# Patient Record
Sex: Female | Born: 1955 | Race: White | Hispanic: No | Marital: Married | Smoking: Former smoker
Health system: Southern US, Community
[De-identification: ages and names within clinical notes are randomized; demographics above are authoritative.]

## PROBLEM LIST (undated history)

## (undated) DIAGNOSIS — E119 Type 2 diabetes mellitus without complications: Secondary | ICD-10-CM

## (undated) DIAGNOSIS — I10 Essential (primary) hypertension: Secondary | ICD-10-CM

## (undated) HISTORY — PX: BREAST BIOPSY: SHX20

## (undated) HISTORY — PX: TONSILECTOMY, ADENOIDECTOMY, BILATERAL MYRINGOTOMY AND TUBES: SHX2538

## (undated) HISTORY — PX: BREAST EXCISIONAL BIOPSY: SUR124

## (undated) HISTORY — PX: CHOLECYSTECTOMY: SHX55

---

## 2009-11-09 ENCOUNTER — Inpatient Hospital Stay (HOSPITAL_COMMUNITY): Admission: EM | Admit: 2009-11-09 | Discharge: 2009-11-12 | Payer: Self-pay | Admitting: Emergency Medicine

## 2009-11-09 ENCOUNTER — Emergency Department (HOSPITAL_COMMUNITY): Admission: EM | Admit: 2009-11-09 | Discharge: 2009-11-09 | Payer: Self-pay | Admitting: Family Medicine

## 2011-01-17 LAB — CBC
HCT: 24.8 % — ABNORMAL LOW (ref 36.0–46.0)
HCT: 26.1 % — ABNORMAL LOW (ref 36.0–46.0)
Hemoglobin: 7.8 g/dL — ABNORMAL LOW (ref 12.0–15.0)
Hemoglobin: 8.2 g/dL — ABNORMAL LOW (ref 12.0–15.0)
MCHC: 31.4 g/dL (ref 30.0–36.0)
MCHC: 31.5 g/dL (ref 30.0–36.0)
MCHC: 31.7 g/dL (ref 30.0–36.0)
MCV: 78.2 fL (ref 78.0–100.0)
Platelets: 381 10*3/uL (ref 150–400)
Platelets: 443 10*3/uL — ABNORMAL HIGH (ref 150–400)
RBC: 3.22 MIL/uL — ABNORMAL LOW (ref 3.87–5.11)
RBC: 3.34 MIL/uL — ABNORMAL LOW (ref 3.87–5.11)
RDW: 20.3 % — ABNORMAL HIGH (ref 11.5–15.5)
RDW: 20.3 % — ABNORMAL HIGH (ref 11.5–15.5)
RDW: 20.4 % — ABNORMAL HIGH (ref 11.5–15.5)
WBC: 11.1 10*3/uL — ABNORMAL HIGH (ref 4.0–10.5)

## 2011-01-17 LAB — IRON AND TIBC
Iron: 11 ug/dL — ABNORMAL LOW (ref 42–135)
Saturation Ratios: 3 % — ABNORMAL LOW (ref 20–55)
UIBC: 308 ug/dL

## 2011-01-17 LAB — DIFFERENTIAL
Basophils Absolute: 0.1 10*3/uL (ref 0.0–0.1)
Basophils Absolute: 0.1 10*3/uL (ref 0.0–0.1)
Basophils Relative: 0 % (ref 0–1)
Eosinophils Relative: 1 % (ref 0–5)
Lymphocytes Relative: 10 % — ABNORMAL LOW (ref 12–46)
Lymphocytes Relative: 18 % (ref 12–46)
Lymphs Abs: 2 10*3/uL (ref 0.7–4.0)
Neutro Abs: 13.8 10*3/uL — ABNORMAL HIGH (ref 1.7–7.7)
Neutro Abs: 8.1 10*3/uL — ABNORMAL HIGH (ref 1.7–7.7)

## 2011-01-17 LAB — GLUCOSE, CAPILLARY
Glucose-Capillary: 146 mg/dL — ABNORMAL HIGH (ref 70–99)
Glucose-Capillary: 147 mg/dL — ABNORMAL HIGH (ref 70–99)

## 2011-01-17 LAB — HEMOGLOBIN A1C: Mean Plasma Glucose: 214 mg/dL

## 2011-01-17 LAB — BASIC METABOLIC PANEL
BUN: 10 mg/dL (ref 6–23)
BUN: 11 mg/dL (ref 6–23)
CO2: 26 mEq/L (ref 19–32)
Calcium: 8.5 mg/dL (ref 8.4–10.5)
Calcium: 8.7 mg/dL (ref 8.4–10.5)
Creatinine, Ser: 1.01 mg/dL (ref 0.4–1.2)
GFR calc non Af Amer: 58 mL/min — ABNORMAL LOW (ref >60)
GFR calc non Af Amer: >60 mL/min (ref >60)
Glucose, Bld: 217 mg/dL — ABNORMAL HIGH (ref 70–99)
Glucose, Bld: 279 mg/dL — ABNORMAL HIGH (ref 70–99)
Potassium: 3.8 mEq/L (ref 3.5–5.1)
Sodium: 136 mEq/L (ref 135–145)

## 2011-01-17 LAB — CROSSMATCH: Antibody Screen: NEGATIVE

## 2011-01-17 LAB — FERRITIN: Ferritin: 12 ng/mL (ref 10–291)

## 2011-01-17 LAB — VITAMIN B12: Vitamin B-12: 1145 pg/mL — ABNORMAL HIGH (ref 211–911)

## 2016-01-06 ENCOUNTER — Other Ambulatory Visit: Payer: Self-pay

## 2016-01-06 DIAGNOSIS — Z1231 Encounter for screening mammogram for malignant neoplasm of breast: Secondary | ICD-10-CM

## 2016-01-21 ENCOUNTER — Ambulatory Visit
Admission: RE | Admit: 2016-01-21 | Discharge: 2016-01-21 | Disposition: A | Discharge status: Home / Self Care | Payer: BLUE CROSS/BLUE SHIELD | Source: Ambulatory Visit

## 2016-01-21 DIAGNOSIS — Z1231 Encounter for screening mammogram for malignant neoplasm of breast: Secondary | ICD-10-CM

## 2016-01-22 ENCOUNTER — Other Ambulatory Visit: Payer: Self-pay | Admitting: Internal Medicine

## 2016-01-22 DIAGNOSIS — R928 Other abnormal and inconclusive findings on diagnostic imaging of breast: Secondary | ICD-10-CM

## 2016-01-28 ENCOUNTER — Ambulatory Visit
Admission: RE | Admit: 2016-01-28 | Discharge: 2016-01-28 | Disposition: A | Discharge status: Home / Self Care | Payer: BLUE CROSS/BLUE SHIELD | Source: Ambulatory Visit | Attending: Internal Medicine | Admitting: Internal Medicine

## 2016-01-28 DIAGNOSIS — R928 Other abnormal and inconclusive findings on diagnostic imaging of breast: Secondary | ICD-10-CM

## 2017-02-25 ENCOUNTER — Other Ambulatory Visit: Payer: Self-pay | Admitting: Internal Medicine

## 2017-02-25 DIAGNOSIS — Z1231 Encounter for screening mammogram for malignant neoplasm of breast: Secondary | ICD-10-CM

## 2017-03-24 ENCOUNTER — Ambulatory Visit: Payer: BLUE CROSS/BLUE SHIELD

## 2017-05-05 ENCOUNTER — Ambulatory Visit
Admission: RE | Admit: 2017-05-05 | Discharge: 2017-05-05 | Disposition: A | Discharge status: Home / Self Care | Payer: BLUE CROSS/BLUE SHIELD | Source: Ambulatory Visit | Attending: Internal Medicine | Admitting: Internal Medicine

## 2017-05-05 DIAGNOSIS — Z1231 Encounter for screening mammogram for malignant neoplasm of breast: Secondary | ICD-10-CM

## 2018-09-18 ENCOUNTER — Other Ambulatory Visit: Payer: Self-pay | Admitting: Internal Medicine

## 2018-09-18 DIAGNOSIS — Z1231 Encounter for screening mammogram for malignant neoplasm of breast: Secondary | ICD-10-CM

## 2018-11-02 ENCOUNTER — Encounter: Payer: Self-pay | Admitting: Radiology

## 2018-11-02 ENCOUNTER — Ambulatory Visit
Admission: RE | Admit: 2018-11-02 | Discharge: 2018-11-02 | Disposition: A | Discharge status: Home / Self Care | Payer: BLUE CROSS/BLUE SHIELD | Source: Ambulatory Visit | Attending: Internal Medicine | Admitting: Internal Medicine

## 2018-11-02 DIAGNOSIS — Z1231 Encounter for screening mammogram for malignant neoplasm of breast: Secondary | ICD-10-CM

## 2019-09-10 ENCOUNTER — Encounter (HOSPITAL_COMMUNITY): Payer: Self-pay | Admitting: Emergency Medicine

## 2019-09-10 ENCOUNTER — Emergency Department (HOSPITAL_COMMUNITY): Payer: BC Managed Care – PPO

## 2019-09-10 ENCOUNTER — Emergency Department (HOSPITAL_COMMUNITY)
Admission: EM | Admit: 2019-09-10 | Discharge: 2019-09-11 | Disposition: A | Discharge status: Home / Self Care | Payer: BC Managed Care – PPO | Attending: Emergency Medicine | Admitting: Emergency Medicine

## 2019-09-10 DIAGNOSIS — I10 Essential (primary) hypertension: Secondary | ICD-10-CM | POA: Diagnosis not present

## 2019-09-10 DIAGNOSIS — E1165 Type 2 diabetes mellitus with hyperglycemia: Secondary | ICD-10-CM | POA: Diagnosis not present

## 2019-09-10 DIAGNOSIS — I639 Cerebral infarction, unspecified: Secondary | ICD-10-CM | POA: Diagnosis present

## 2019-09-10 DIAGNOSIS — R739 Hyperglycemia, unspecified: Secondary | ICD-10-CM

## 2019-09-10 HISTORY — DX: Essential (primary) hypertension: I10

## 2019-09-10 HISTORY — DX: Type 2 diabetes mellitus without complications: E11.9

## 2019-09-10 LAB — ETHANOL: Alcohol, Ethyl (B): <10 mg/dL (ref <10)

## 2019-09-10 LAB — COMPREHENSIVE METABOLIC PANEL
ALT: 22 U/L (ref 0–44)
AST: 19 U/L (ref 15–41)
Albumin: 3.9 g/dL (ref 3.5–5.0)
Alkaline Phosphatase: 59 U/L (ref 38–126)
Anion gap: 12 (ref 5–15)
BUN: 22 mg/dL (ref 8–23)
CO2: 23 mmol/L (ref 22–32)
Calcium: 9.7 mg/dL (ref 8.9–10.3)
Chloride: 96 mmol/L — ABNORMAL LOW (ref 98–111)
Creatinine, Ser: 0.99 mg/dL (ref 0.44–1.00)
GFR calc Af Amer: >60 mL/min (ref >60)
GFR calc non Af Amer: >60 mL/min (ref >60)
Glucose, Bld: 400 mg/dL — ABNORMAL HIGH (ref 70–99)
Potassium: 3.7 mmol/L (ref 3.5–5.1)
Sodium: 131 mmol/L — ABNORMAL LOW (ref 135–145)
Total Bilirubin: 0.8 mg/dL (ref 0.3–1.2)
Total Protein: 7.1 g/dL (ref 6.5–8.1)

## 2019-09-10 LAB — I-STAT CHEM 8, ED
BUN: 24 mg/dL — ABNORMAL HIGH (ref 8–23)
Calcium, Ion: 1.26 mmol/L (ref 1.15–1.40)
Chloride: 97 mmol/L — ABNORMAL LOW (ref 98–111)
Creatinine, Ser: 0.8 mg/dL (ref 0.44–1.00)
Glucose, Bld: 418 mg/dL — ABNORMAL HIGH (ref 70–99)
HCT: 44 % (ref 36.0–46.0)
Hemoglobin: 15 g/dL (ref 12.0–15.0)
Potassium: 3.7 mmol/L (ref 3.5–5.1)
Sodium: 134 mmol/L — ABNORMAL LOW (ref 135–145)
TCO2: 26 mmol/L (ref 22–32)

## 2019-09-10 LAB — CBC
HCT: 42.3 % (ref 36.0–46.0)
Hemoglobin: 14.2 g/dL (ref 12.0–15.0)
MCH: 29.5 pg (ref 26.0–34.0)
MCHC: 33.6 g/dL (ref 30.0–36.0)
MCV: 87.9 fL (ref 80.0–100.0)
Platelets: 261 10*3/uL (ref 150–400)
RBC: 4.81 MIL/uL (ref 3.87–5.11)
RDW: 12 % (ref 11.5–15.5)
WBC: 9.3 10*3/uL (ref 4.0–10.5)
nRBC: 0 % (ref 0.0–0.2)

## 2019-09-10 LAB — CBG MONITORING, ED: Glucose-Capillary: 413 mg/dL — ABNORMAL HIGH (ref 70–99)

## 2019-09-10 LAB — PROTIME-INR
INR: 1 (ref 0.8–1.2)
Prothrombin Time: 13.2 seconds (ref 11.4–15.2)

## 2019-09-10 LAB — DIFFERENTIAL
Abs Immature Granulocytes: 0.07 10*3/uL (ref 0.00–0.07)
Basophils Absolute: 0 10*3/uL (ref 0.0–0.1)
Basophils Relative: 0 %
Eosinophils Absolute: 0.1 10*3/uL (ref 0.0–0.5)
Eosinophils Relative: 1 %
Immature Granulocytes: 1 %
Lymphocytes Relative: 26 %
Lymphs Abs: 2.4 10*3/uL (ref 0.7–4.0)
Monocytes Absolute: 0.7 10*3/uL (ref 0.1–1.0)
Monocytes Relative: 8 %
Neutro Abs: 6 10*3/uL (ref 1.7–7.7)
Neutrophils Relative %: 64 %

## 2019-09-10 LAB — APTT: aPTT: 26 seconds (ref 24–36)

## 2019-09-10 NOTE — ED Triage Notes (Signed)
Pt here with c/o right side weakness that started Friday along with some memory loss , pt cbg 413 , pt does have a history of htn

## 2019-09-11 ENCOUNTER — Emergency Department (HOSPITAL_COMMUNITY): Payer: BC Managed Care – PPO

## 2019-09-11 LAB — URINALYSIS, ROUTINE W REFLEX MICROSCOPIC
Bilirubin Urine: NEGATIVE
Glucose, UA: >=500 mg/dL — AB
Hgb urine dipstick: NEGATIVE
Ketones, ur: NEGATIVE mg/dL
Nitrite: NEGATIVE
Protein, ur: NEGATIVE mg/dL
Specific Gravity, Urine: 1.003 — ABNORMAL LOW (ref 1.005–1.030)
pH: 6 (ref 5.0–8.0)

## 2019-09-11 LAB — RAPID URINE DRUG SCREEN, HOSP PERFORMED
Amphetamines: NOT DETECTED
Barbiturates: NOT DETECTED
Benzodiazepines: NOT DETECTED
Cocaine: NOT DETECTED
Opiates: NOT DETECTED
Tetrahydrocannabinol: NOT DETECTED

## 2019-09-11 LAB — CBG MONITORING, ED
Glucose-Capillary: 324 mg/dL — ABNORMAL HIGH (ref 70–99)
Glucose-Capillary: 325 mg/dL — ABNORMAL HIGH (ref 70–99)

## 2019-09-11 MED ORDER — METFORMIN HCL 500 MG PO TABS: 750.0000 mg | ORAL_TABLET | Freq: Three times a day (TID) | ORAL | 1 refills | Status: DC

## 2019-09-11 MED ORDER — INSULIN ASPART 100 UNIT/ML ~~LOC~~ SOLN
5.0000 [IU] | Freq: Once | SUBCUTANEOUS | Status: AC
  Administered 2019-09-11: 5 [IU] via SUBCUTANEOUS

## 2019-09-11 MED ORDER — METFORMIN HCL ER 750 MG PO TB24: 750.0000 mg | ORAL_TABLET | Freq: Three times a day (TID) | ORAL | 1 refills | Status: DC

## 2019-09-11 MED ORDER — INSULIN REGULAR(HUMAN) IN NACL 100-0.9 UT/100ML-% IV SOLN: INTRAVENOUS | Status: DC

## 2019-09-11 MED ORDER — DEXTROSE-NACL 5-0.45 % IV SOLN: INTRAVENOUS | Status: DC

## 2019-09-11 MED ORDER — SODIUM CHLORIDE 0.9 % IV BOLUS
1000.0000 mL | Freq: Once | INTRAVENOUS | Status: AC
  Administered 2019-09-11: 07:00:00 1000 mL via INTRAVENOUS

## 2019-09-11 NOTE — ED Notes (Signed)
Pt is NSR on monitor 

## 2019-09-11 NOTE — Discharge Instructions (Signed)
The lab work-up and MRI of your brain did not reveal any concerning abnormalities. We recommend that you follow-up with neurologist in 2 weeks. Return to the ER if you have severe symptoms again.  Additionally, we have prescribed you Metformin 750 mg 3 times daily.  Please get a primary care doctor to help you with diabetes.  The medicine is available at [facility] at the moment.

## 2019-09-11 NOTE — ED Notes (Signed)
Patient transported to MRI 

## 2019-09-11 NOTE — ED Provider Notes (Addendum)
  Physical Exam  BP 119/86   Pulse 64   Temp 97.6 F (36.4 C) (Oral)   Resp 10   Ht 5\' 10"  (1.778 m)   Wt 99.8 kg   SpO2 95%   BMI 31.57 kg/m   Physical Exam  ED Course/Procedures   Clinical Course as of Sep 10 1042  Tue Sep 11, 2019  0621 Spoke with Dr. Leonel Ramsay.  He recommends MRI.     [CH]    Clinical Course User Index [CH] Horton, Barbette Hair, MD    Procedures  MDM   Assuming care of patient from Dr. Dina Rich.   Patient in the ED for right-sided weakness along with some aphasia and memory loss type symptoms. Workup thus far shows slightly elevated blood sugar without CT abnormalities./  Concerning findings are as following : None Important pending results are : MRI of the brain/  According to Dr. Dina Rich, plan is to discharge the patient if the MRI is negative with neuro follow-up.  Patient had no complains, no concerns from the nursing side. Will continue to monitor.  10:48 AM Results of the MRI discussed with the patient.  MRI is negative for any acute findings.  She will follow-up with neurology as an outpatient. Patient request that we refill her diabetes medication.  She was on Metformin but it was removed off of the shelf therefore she was switched to Jardiance or Trulicity and she could not afford those medications.  She has no longer been seeing the same doctor and wants an alternative.  I called [facility] and they have Metformin 750 available now.  She will be prescribed her 750 3 times daily by [place] with 1 refill as she will not be able to get a new doctor until January.  She reports that Metformin 750 3 times daily was managing her blood sugar appropriately and her A1c was under control.  She understands that the ED doctor is not appropriate for chronic meds and condition like diabetes.       Varney Biles, MD 09/11/19 1058

## 2019-09-11 NOTE — ED Provider Notes (Signed)
[facility] EMERGENCY DEPARTMENT Provider Note   CSN: [id number] Arrival date & time: 09/10/19  1817     History   Chief Complaint Chief Complaint  Patient presents with  . Cerebrovascular Accident    HPI Elizabeth Wood is a 63 y.o. female.     HPI  This is a 63 year old female with a history of diabetes and hypertension who presents with strokelike symptoms.  Patient reports she had onset of "feeling weird" on Friday.  She describes an episode of knowing what I wanted to do but not being able to do it.  She states that she was looking at the phone but could not come up with the word or reach out to grab it.  Symptoms improved however she continued to feel foggy throughout the weekend.  She states that at 5 PM yesterday she had another significant episode where she looked at the phone, knew what she wanted to say but could not say it.  She describes reaching out for things but them not being in the right direction.  She denies any recent illnesses or fevers.  She has not noted any weakness or gait disturbance.  She is a diabetic.  Past Medical History:  Diagnosis Date  . Diabetes mellitus without complication ([place])   . Hypertension     There are no active problems to display for this patient.   Past Surgical History:  Procedure Laterality Date  . BREAST BIOPSY Left    benign  . BREAST BIOPSY Right    benign  . BREAST EXCISIONAL BIOPSY Left   . BREAST EXCISIONAL BIOPSY Right      OB History   No obstetric history on file.      Home Medications    Prior to Admission medications   Not on File    Family History History reviewed. No pertinent family history.  Social History Social History   Tobacco Use  . Smoking status: Never Smoker  . Smokeless tobacco: Never Used  Substance Use Topics  . Alcohol use: Not on file  . Drug use: Not on file     Allergies   Patient has no allergy information on record.   Review of Systems Review  of Systems  Constitutional: Negative for fever.  Respiratory: Negative for shortness of breath.   Cardiovascular: Negative for chest pain.  Gastrointestinal: Negative for abdominal pain, nausea and vomiting.  Genitourinary: Negative for dysuria.  Neurological: Positive for speech difficulty. Negative for dizziness, weakness and headaches.  All other systems reviewed and are negative.    Physical Exam Updated Vital Signs BP 123/83   Pulse 68   Temp 97.6 F (36.4 C) (Oral)   Resp 14   Ht 1.778 m (5\' 10" )   Wt 99.8 kg   SpO2 95%   BMI 31.57 kg/m   Physical Exam Vitals signs and nursing note reviewed.  Constitutional:      Appearance: She is well-developed. She is obese. She is not ill-appearing.  HENT:     Head: Normocephalic and atraumatic.     Mouth/Throat:     Mouth: Mucous membranes are moist.  Eyes:     Pupils: Pupils are equal, round, and reactive to light.  Neck:     Musculoskeletal: Neck supple.  Cardiovascular:     Rate and Rhythm: Normal rate and regular rhythm.     Heart sounds: Normal heart sounds.  Pulmonary:     Effort: Pulmonary effort is normal. No respiratory distress.  Breath sounds: No wheezing.  Abdominal:     Palpations: Abdomen is soft.     Tenderness: There is no abdominal tenderness.  Musculoskeletal:     Right lower leg: No edema.     Left lower leg: No edema.  Skin:    General: Skin is warm and dry.  Neurological:     Mental Status: She is alert and oriented to person, place, and time.     Comments: Fluent speech, oriented x3, no drift, patient seems to not be able to have distinction between the fingers specifically on her right hand worse than left hand, 5 of 5 strength in all 4 extremities  Psychiatric:     Comments: Anxious      ED Treatments / Results  Labs (all labs ordered are listed, but only abnormal results are displayed) Labs Reviewed  COMPREHENSIVE METABOLIC PANEL - Abnormal; Notable for the following components:       Result Value   Sodium 131 (*)    Chloride 96 (*)    Glucose, Bld 400 (*)    All other components within normal limits  CBG MONITORING, ED - Abnormal; Notable for the following components:   Glucose-Capillary 413 (*)    All other components within normal limits  I-STAT CHEM 8, ED - Abnormal; Notable for the following components:   Sodium 134 (*)    Chloride 97 (*)    BUN 24 (*)    Glucose, Bld 418 (*)    All other components within normal limits  CBG MONITORING, ED - Abnormal; Notable for the following components:   Glucose-Capillary 324 (*)    All other components within normal limits  ETHANOL  PROTIME-INR  APTT  CBC  DIFFERENTIAL  RAPID URINE DRUG SCREEN, HOSP PERFORMED  URINALYSIS, ROUTINE W REFLEX MICROSCOPIC    EKG None  Radiology Ct Head Wo Contrast  Result Date: 09/10/2019 CLINICAL DATA:  Right-sided weakness with memory loss EXAM: CT HEAD WITHOUT CONTRAST TECHNIQUE: Contiguous axial images were obtained from the base of the skull through the vertex without intravenous contrast. COMPARISON:  None. FINDINGS: Brain: No acute territorial infarction, hemorrhage, or intracranial mass. Small focal hypodensity within the left centrum semiovale which may reflect age indeterminate lacunar infarct. The ventricles are of normal size. Vascular: No hyperdense vessels. Vertebral and carotid vascular calcification Skull: Normal. Negative for fracture or focal lesion. Sinuses/Orbits: No acute finding. Other: None IMPRESSION: 1. Negative for hemorrhage or intracranial mass. 2. 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 Electronically Signed   By: Jasmine Pang M.D.   On: 09/10/2019 19:35    Procedures Procedures (including critical care time)  Medications Ordered in ED Medications  sodium chloride 0.9 % bolus 1,000 mL (1,000 mLs Intravenous New Bag/Given 09/11/19 3818)     Initial Impression / Assessment and Plan / ED Course  I have reviewed the  triage vital signs and the nursing notes.  Pertinent labs & imaging results that were available during my care of the patient were reviewed by me and considered in my medical decision making (see chart for details).  Clinical Course as of Sep 10 701  Tue Sep 11, 2019  2993 Spoke with Dr. Amada Jupiter.  He recommends MRI.     [CH]    Clinical Course User Index [CH] Horton, Mayer Masker, MD       Patient presents with some feelings of word finding difficulty and proprioceptive difficulties.  She is nontoxic-appearing but anxious and vital signs are  reassuring.  Only neurologic deficit that I can pick up on exam is difficulty with distinction between her fingers on the right hand greater than the left hand.  I discussed the patient with Dr. Amada JupiterKirkpatrick.  Given ongoing symptoms, he recommends MRI to determine disposition.  CT scan did show an age-indeterminate likely lacunar infarct.  Final Clinical Impressions(s) / ED Diagnoses   Final diagnoses:  None    ED Discharge Orders    None       Shon BatonHorton, Courtney F, MD 09/11/19 [phone]

## 2020-04-07 ENCOUNTER — Other Ambulatory Visit: Payer: Self-pay | Admitting: Internal Medicine

## 2020-04-07 ENCOUNTER — Other Ambulatory Visit: Payer: Self-pay | Admitting: Family Medicine

## 2020-04-07 DIAGNOSIS — Z1231 Encounter for screening mammogram for malignant neoplasm of breast: Secondary | ICD-10-CM

## 2020-04-14 ENCOUNTER — Ambulatory Visit: Payer: BC Managed Care – PPO | Attending: Internal Medicine

## 2020-04-14 DIAGNOSIS — Z23 Encounter for immunization: Secondary | ICD-10-CM

## 2020-04-14 NOTE — Progress Notes (Signed)
   Covid-19 Vaccination Clinic  Name:  Elizabeth Wood    MRN: [id number] DOB: 1956/09/09  04/14/2020  [name] was observed post Covid-19 immunization for 30 minutes based on pre-vaccination screening without incident. She was provided with Vaccine Information Sheet and instruction to access the V-Safe system.   [name] was instructed to call 911 with any severe reactions post vaccine: Marland Kitchen Difficulty breathing  . Swelling of face and throat  . A fast heartbeat  . A bad rash all over body  . Dizziness and weakness   Immunizations Administered    Name Date Dose VIS Date Route   JANSSEN COVID-19 VACCINE 04/14/2020  9:29 AM 0.5 mL 12/29/2019 Intramuscular   Manufacturer: [employer]   Lot: [id number]   NDC: 16073-710-62

## 2020-04-18 ENCOUNTER — Ambulatory Visit: Payer: BC Managed Care – PPO

## 2020-06-10 ENCOUNTER — Other Ambulatory Visit: Payer: Self-pay

## 2020-06-10 ENCOUNTER — Other Ambulatory Visit: Payer: Self-pay | Admitting: Family Medicine

## 2020-06-10 ENCOUNTER — Ambulatory Visit
Admission: RE | Admit: 2020-06-10 | Discharge: 2020-06-10 | Disposition: A | Discharge status: Home / Self Care | Payer: BC Managed Care – PPO | Source: Ambulatory Visit | Attending: Family Medicine | Admitting: Family Medicine

## 2020-06-10 DIAGNOSIS — N63 Unspecified lump in unspecified breast: Secondary | ICD-10-CM

## 2020-06-10 DIAGNOSIS — Z1231 Encounter for screening mammogram for malignant neoplasm of breast: Secondary | ICD-10-CM

## 2020-07-01 ENCOUNTER — Other Ambulatory Visit: Payer: Self-pay

## 2020-07-01 ENCOUNTER — Ambulatory Visit
Admission: RE | Admit: 2020-07-01 | Discharge: 2020-07-01 | Disposition: A | Discharge status: Home / Self Care | Payer: BC Managed Care – PPO | Source: Ambulatory Visit | Attending: Family Medicine | Admitting: Family Medicine

## 2020-07-01 ENCOUNTER — Ambulatory Visit: Payer: BC Managed Care – PPO

## 2020-07-01 DIAGNOSIS — N63 Unspecified lump in unspecified breast: Secondary | ICD-10-CM

## 2022-04-08 ENCOUNTER — Other Ambulatory Visit: Payer: Self-pay | Admitting: Family Medicine

## 2022-04-08 DIAGNOSIS — Z1231 Encounter for screening mammogram for malignant neoplasm of breast: Secondary | ICD-10-CM

## 2022-04-13 ENCOUNTER — Ambulatory Visit
Admission: RE | Admit: 2022-04-13 | Discharge: 2022-04-13 | Disposition: A | Discharge status: Home / Self Care | Payer: BC Managed Care – PPO | Source: Ambulatory Visit | Attending: Family Medicine | Admitting: Family Medicine

## 2022-04-13 DIAGNOSIS — Z1231 Encounter for screening mammogram for malignant neoplasm of breast: Secondary | ICD-10-CM

## 2023-08-11 ENCOUNTER — Other Ambulatory Visit: Payer: Self-pay | Admitting: Family Medicine

## 2023-08-11 DIAGNOSIS — E2839 Other primary ovarian failure: Secondary | ICD-10-CM

## 2023-10-10 ENCOUNTER — Other Ambulatory Visit: Payer: Self-pay | Admitting: Family Medicine

## 2023-10-10 DIAGNOSIS — Z0000 Encounter for general adult medical examination without abnormal findings: Secondary | ICD-10-CM

## 2023-10-11 ENCOUNTER — Ambulatory Visit: Payer: BC Managed Care – PPO

## 2023-10-13 ENCOUNTER — Ambulatory Visit
Admission: RE | Admit: 2023-10-13 | Discharge: 2023-10-13 | Disposition: A | Discharge status: Home / Self Care | Payer: BC Managed Care – PPO | Source: Ambulatory Visit | Attending: Family Medicine | Admitting: Family Medicine

## 2023-10-13 DIAGNOSIS — Z0000 Encounter for general adult medical examination without abnormal findings: Secondary | ICD-10-CM

## 2023-10-17 ENCOUNTER — Other Ambulatory Visit: Payer: Self-pay | Admitting: Family Medicine

## 2023-10-17 DIAGNOSIS — R928 Other abnormal and inconclusive findings on diagnostic imaging of breast: Secondary | ICD-10-CM

## 2023-10-31 ENCOUNTER — Other Ambulatory Visit: Payer: Self-pay | Admitting: Family Medicine

## 2023-10-31 ENCOUNTER — Ambulatory Visit
Admission: RE | Admit: 2023-10-31 | Discharge: 2023-10-31 | Disposition: A | Discharge status: Home / Self Care | Payer: BC Managed Care – PPO | Source: Ambulatory Visit | Attending: Family Medicine | Admitting: Family Medicine

## 2023-10-31 DIAGNOSIS — R928 Other abnormal and inconclusive findings on diagnostic imaging of breast: Secondary | ICD-10-CM

## 2023-10-31 DIAGNOSIS — N6320 Unspecified lump in the left breast, unspecified quadrant: Secondary | ICD-10-CM

## 2023-10-31 HISTORY — PX: BREAST BIOPSY: SHX20

## 2023-11-01 LAB — SURGICAL PATHOLOGY

## 2023-11-02 DIAGNOSIS — C801 Malignant (primary) neoplasm, unspecified: Secondary | ICD-10-CM

## 2023-11-02 HISTORY — DX: Malignant (primary) neoplasm, unspecified: C80.1

## 2023-11-02 HISTORY — PX: BREAST LUMPECTOMY: SHX2

## 2023-11-03 ENCOUNTER — Telehealth: Payer: Self-pay | Admitting: *Deleted

## 2023-11-03 NOTE — Telephone Encounter (Signed)
 Spoke to patient to confirm upcoming morning [facility] clinic appointment on 1/8, paperwork will be sent via mail.  Gave location and time, also informed patient that the surgeon's office would be calling as well to get information from them similar to the packet that they will be receiving so make sure to do both.  Reminded patient that all providers will be coming to the clinic to see them HERE and if they had any questions to not hesitate to reach back out to myself or their navigators.

## 2023-11-07 ENCOUNTER — Encounter: Payer: Self-pay | Admitting: *Deleted

## 2023-11-07 DIAGNOSIS — C50412 Malignant neoplasm of upper-outer quadrant of left female breast: Secondary | ICD-10-CM | POA: Insufficient documentation

## 2023-11-08 NOTE — Progress Notes (Signed)
 Radiation Oncology         [phone] ________________________________  Initial Outpatient Consultation  Name: Elizabeth Wood MRN: [id number]  Date: 11/09/2023  DOB: July 24, 1956  RR:[id number], Lamarr RAMAN, MD  Aron Shoulders, MD   REFERRING PHYSICIAN: Aron Shoulders, MD  DIAGNOSIS:    ICD-10-CM   1. Malignant neoplasm of upper-outer quadrant of left breast in female, estrogen receptor positive (HCC)  C50.412    Z17.0        Cancer Staging  Malignant neoplasm of upper-outer quadrant of left breast in female, estrogen receptor positive (HCC) Staging form: Breast, AJCC 8th Edition - Clinical stage from 11/09/2023: Stage IA (cT1, cN0, cM0, G3, ER+, PR+, HER2-) - Unsigned Stage prefix: Initial diagnosis Histologic grading system: 3 grade system Laterality: Left Staged by: Pathologist and managing physician Stage used in treatment planning: Yes National guidelines used in treatment planning: Yes Type of national guideline used in treatment planning: NCCN    CHIEF COMPLAINT: Here to discuss management of left breast cancer  HISTORY OF PRESENT ILLNESS::Elizabeth Wood is a 69 y.o. female who presented with bilateral breast abnormalities on the following imaging: bilateral screening mammogram on the date of 10/13/23. No symptoms, if any, were reported at that time.  Bilateral diagnostic mammogram and bilateral breast ultrasound on 10/31/23 further revealed a suspicious mass with calcifications in the 2 o'clock left breast measuring 1.7 cm, located 8 cmfn, and a benign sebaceous cyst in the 3 o'clock right breast measuring 1.3 cm. No evidence of lymphadenopathy was demonstrated in either axilla.   Biopsy of the upper outer (2 o'clock) left breast on date of 10/31/23 showed grade 3 invasive ductal carcinoma measuring 1.0 cm in the greatest linear extent of the sample .  ER status: 95% positive and PR status 95% positive, both with strong staining intensity; Proliferation marker Ki67 at 40%;  Her2 status negative; Grade 3. No lymph nodes were examined.   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  She works over 50 hours a week.  She previously worked as a radiation protection practitioner in [place] .  PREVIOUS RADIATION THERAPY: No  PAST MEDICAL HISTORY:  has a past medical history of Diabetes mellitus without complication (HCC) and Hypertension.    PAST SURGICAL HISTORY: Past Surgical History:  Procedure Laterality Date   BREAST BIOPSY Left    benign   BREAST BIOPSY Right    benign   BREAST BIOPSY Left 10/31/2023   US  LT BREAST BX W LOC DEV 1ST LESION IMG BX SPEC US  GUIDE 10/31/2023 [facility] MAMMOGRAPHY   BREAST EXCISIONAL BIOPSY Left    BREAST EXCISIONAL BIOPSY Right    CHOLECYSTECTOMY     TONSILECTOMY, ADENOIDECTOMY, BILATERAL MYRINGOTOMY AND TUBES      FAMILY HISTORY: family history is not on file.  SOCIAL HISTORY:  reports that she has quit smoking. Her smoking use included cigarettes. She has never used smokeless tobacco. She reports that she does not drink alcohol and does not use drugs.  ALLERGIES: Lidocaine and Novocain [procaine]  MEDICATIONS:  Current Outpatient Medications  Medication Sig Dispense Refill   empagliflozin (JARDIANCE) 25 MG TABS tablet 25 mg daily.     gabapentin (NEURONTIN) 300 MG capsule Take 300 mg by mouth at bedtime.     glipiZIDE (GLUCOTROL) 5 MG tablet Take 5 mg by mouth daily before breakfast.     lisinopril (ZESTRIL) 20 MG tablet Take 20 mg by mouth daily.     metFORMIN  (GLUCOPHAGE ) 500 MG tablet Take 1.5 tablets (750  mg total) by mouth 3 (three) times daily after meals. 150 tablet 1   metFORMIN  (GLUCOPHAGE ) 500 MG tablet Take 1,000 mg by mouth 2 (two) times daily with a meal.     OZEMPIC, 1 MG/DOSE, 4 MG/3ML SOPN Inject 1 mg into the skin once a week.     No current facility-administered medications for this encounter.    REVIEW OF SYSTEMS: As above in HPI.   PHYSICAL EXAM:  vitals were not taken for this visit.    General: Alert and oriented, in no acute distress HEENT: Head is normocephalic. Extraocular movements are intact.   Neck: Neck is supple, no palpable cervical or supraclavicular lymphadenopathy. Heart: Regular in rate and rhythm with no murmurs, rubs, or gallops. Chest: Clear to auscultation bilaterally, with no rhonchi, wheezes, or rales. Abdomen: Soft, nontender, nondistended, with no rigidity or guarding. Extremities: No cyanosis or edema. Lymphatics: see Neck Exam Skin: No concerning lesions. Musculoskeletal: symmetric strength and muscle tone throughout. Neurologic: Cranial nerves II through XII are grossly intact. No obvious focalities. Speech is fluent. Coordination is intact. Psychiatric: Judgment and insight are intact. Affect is appropriate. Breasts: At the 12 o'clock position of the left breast there is an area of thickening that extends about 3 cm.  There is a cystic superficial mass that is bluish in color at the 3 o'clock position of the inner right breast.   No other palpable masses appreciated in the breasts or axillae bilaterally.    ECOG = 0  0 - Asymptomatic (Fully active, able to carry on all predisease activities without restriction)  1 - Symptomatic but completely ambulatory (Restricted in physically strenuous activity but ambulatory and able to carry out work of a light or sedentary nature. For example, light housework, office work)  2 - Symptomatic, <50% in bed during the day (Ambulatory and capable of all self care but unable to carry out any work activities. Up and about more than 50% of waking hours)  3 - Symptomatic, >50% in bed, but not bedbound (Capable of only limited self-care, confined to bed or chair 50% or more of waking hours)  4 - Bedbound (Completely disabled. Cannot carry on any self-care. Totally confined to bed or chair)  5 - Death   Raylene MM, Creech RH, Tormey DC, et al. [phone]). Toxicity and response criteria of the [facility]. Am. DOROTHA Bridges. Oncol. 5 (6): 649-55   LABORATORY DATA:  Lab Results  Component Value Date   WBC 9.3 09/10/2019   HGB 15.0 09/10/2019   HCT 44.0 09/10/2019   MCV 87.9 09/10/2019   PLT 261 09/10/2019   CMP     Component Value Date/Time   NA 134 (L) 09/10/2019 1927   K 3.7 09/10/2019 1927   CL 97 (L) 09/10/2019 1927   CO2 23 09/10/2019 1904   GLUCOSE 418 (H) 09/10/2019 1927   BUN 24 (H) 09/10/2019 1927   CREATININE 0.80 09/10/2019 1927   CALCIUM 9.7 09/10/2019 1904   PROT 7.1 09/10/2019 1904   ALBUMIN 3.9 09/10/2019 1904   AST 19 09/10/2019 1904   ALT 22 09/10/2019 1904   ALKPHOS 59 09/10/2019 1904   BILITOT 0.8 09/10/2019 1904   GFRNONAA >60 09/10/2019 1904   GFRAA >60 09/10/2019 1904         RADIOGRAPHY: US  LT BREAST BX W LOC DEV 1ST LESION IMG BX SPEC US  GUIDE Addendum Date: 11/03/2023 ADDENDUM REPORT: 11/03/2023 12:20 ADDENDUM: PATHOLOGY revealed: 1. Breast, left, needle core biopsy, upper outer,  2 o'clock, 8 cmfn :- INVASIVE DUCTAL CARCINOMA. Pathology results are CONCORDANT with imaging findings, per Dr. Chyrl Phi. Pathology results and recommendations were discussed with patient via telephone on 11/01/2023 by Rock Hover RN. Patient reported biopsy site doing well with no adverse symptoms, and only slight tenderness at the site. Post biopsy care instructions were reviewed, questions were answered and my direct phone number was provided. Patient was instructed to call [facility] for any additional questions or concerns related to biopsy site. RECOMMENDATIONS: 1. Surgical and oncological consultation. Request for surgical and oncological consultation relayed to Nurse Oncology Navigators at [facility] on 11/01/2023. Patient is scheduled for MDC (Multidisciplinary Clinic) appointment on 11/09/2023 at 8:00 o'clock. Patient is aware of appointment details. Pathology results reported by Rock Hover RN on 11/03/2023. Electronically Signed   By:  Reyes Phi M.D.   On: 11/03/2023 12:20   Result Date: 11/03/2023 CLINICAL DATA:  68 year old female presents for tissue sampling of 1.7 cm UPPER-OUTER LEFT breast mass. EXAM: ULTRASOUND GUIDED LEFT BREAST CORE NEEDLE BIOPSY COMPARISON:  Previous exam(s). PROCEDURE: I met with the patient and we discussed the procedure of ultrasound-guided biopsy, including benefits and alternatives. We discussed the high likelihood of a successful procedure. We discussed the risks of the procedure, including infection, bleeding, tissue injury, clip migration, and inadequate sampling. Informed written consent was given. The usual time-out protocol was performed immediately prior to the procedure. Lesion quadrant: UPPER-OUTER LEFT breast Using sterile technique and 1% chloroprocaine as local anesthetic, under direct ultrasound visualization, a 12 gauge spring-loaded device was used to perform biopsy of the 1.7 cm mass at the 2 o'clock position of the LEFT breast 8 cm from the nipple using a MEDIAL approach. At the conclusion of the procedure a RIBBON shaped tissue marker clip was deployed into the biopsy cavity. Follow up 2 view mammogram was performed and dictated separately. IMPRESSION: Ultrasound guided biopsy of 1.7 cm UPPER OUTER LEFT breast mass. No apparent complications. Electronically Signed: By: Reyes Phi M.D. On: 10/31/2023 11:50   MM CLIP PLACEMENT LEFT Result Date: 10/31/2023 CLINICAL DATA:  Evaluate RIBBON biopsy clip placement following ultrasound-guided LEFT breast biopsy. EXAM: 3D DIAGNOSTIC LEFT MAMMOGRAM POST ULTRASOUND BIOPSY COMPARISON:  Previous exam(s). FINDINGS: 3D Mammographic images were obtained following ultrasound guided biopsy of the 1.7 cm mass at the 2 o'clock position of the LEFT breast. The biopsy marking clip is in expected position at the site of biopsy. IMPRESSION: Appropriate positioning of the RIBBON shaped biopsy marking clip at the site of biopsy in the UPPER OUTER LEFT breast. Final  Assessment: Post Procedure Mammograms for Marker Placement Electronically Signed   By: Reyes Phi M.D.   On: 10/31/2023 12:00   MM 3D DIAGNOSTIC MAMMOGRAM BILATERAL BREAST Result Date: 10/31/2023 CLINICAL DATA:  68 year old female presenting as a recall from screening for possible right breast mass and possible left breast asymmetry with calcifications. EXAM: DIGITAL DIAGNOSTIC BILATERAL MAMMOGRAM WITH TOMOSYNTHESIS AND CAD; ULTRASOUND RIGHT BREAST LIMITED; ULTRASOUND LEFT BREAST LIMITED TECHNIQUE: Bilateral digital diagnostic mammography and breast tomosynthesis was performed. The images were evaluated with computer-aided detection. ; Targeted ultrasound examination of the right breast was performed; Targeted ultrasound examination of the left breast was performed. COMPARISON:  Previous exam(s). ACR Breast Density Category b: There are scattered areas of fibroglandular density. FINDINGS: Mammogram: Right breast: A skin BB marks the site of a fluctuating sebaceous cyst reported by the patient. This corresponds to the mass identified mammographically. Left breast: Spot compression tomosynthesis  views and spot 2D magnification views were performed demonstrating persistence of an irregular mass in the upper outer left breast with associated small group of calcifications. Overall this area spans 1.8 cm. On physical exam of the upper-outer left breast I feel a fixed discrete mass. Ultrasound: Targeted ultrasound is performed in the right breast at 3 o'clock 14 cm from the nipple demonstrating an oval circumscribed cystic mass within the skin measuring 1.3 x 0.7 x 1.2 cm, most consistent with a benign sebaceous cyst. Targeted ultrasound performed in the left breast at 2 o'clock 8 cm from the nipple demonstrates an irregular hypoechoic mass measuring 1.7 x 0.9 x 1.7 cm. This corresponds to the mammographic finding. Targeted ultrasound of the left axilla demonstrates normal lymph nodes. IMPRESSION: 1. Suspicious mass  with calcifications in the left breast at 2 o'clock measuring 1.7 cm. 2. Benign sebaceous cyst in the right breast at 3 o'clock measuring 1.3 cm. RECOMMENDATION: Ultrasound-guided core needle biopsy x1 of the left breast. I have discussed the findings and recommendations with the patient. If applicable, a reminder letter will be sent to the patient regarding the next appointment. BI-RADS CATEGORY  4: Suspicious. Electronically Signed   By: Inocente Ast M.D.   On: 10/31/2023 11:03   US  LIMITED ULTRASOUND INCLUDING AXILLA LEFT BREAST  Result Date: 10/31/2023 CLINICAL DATA:  68 year old female presenting as a recall from screening for possible right breast mass and possible left breast asymmetry with calcifications. EXAM: DIGITAL DIAGNOSTIC BILATERAL MAMMOGRAM WITH TOMOSYNTHESIS AND CAD; ULTRASOUND RIGHT BREAST LIMITED; ULTRASOUND LEFT BREAST LIMITED TECHNIQUE: Bilateral digital diagnostic mammography and breast tomosynthesis was performed. The images were evaluated with computer-aided detection. ; Targeted ultrasound examination of the right breast was performed; Targeted ultrasound examination of the left breast was performed. COMPARISON:  Previous exam(s). ACR Breast Density Category b: There are scattered areas of fibroglandular density. FINDINGS: Mammogram: Right breast: A skin BB marks the site of a fluctuating sebaceous cyst reported by the patient. This corresponds to the mass identified mammographically. Left breast: Spot compression tomosynthesis views and spot 2D magnification views were performed demonstrating persistence of an irregular mass in the upper outer left breast with associated small group of calcifications. Overall this area spans 1.8 cm. On physical exam of the upper-outer left breast I feel a fixed discrete mass. Ultrasound: Targeted ultrasound is performed in the right breast at 3 o'clock 14 cm from the nipple demonstrating an oval circumscribed cystic mass within the skin measuring  1.3 x 0.7 x 1.2 cm, most consistent with a benign sebaceous cyst. Targeted ultrasound performed in the left breast at 2 o'clock 8 cm from the nipple demonstrates an irregular hypoechoic mass measuring 1.7 x 0.9 x 1.7 cm. This corresponds to the mammographic finding. Targeted ultrasound of the left axilla demonstrates normal lymph nodes. IMPRESSION: 1. Suspicious mass with calcifications in the left breast at 2 o'clock measuring 1.7 cm. 2. Benign sebaceous cyst in the right breast at 3 o'clock measuring 1.3 cm. RECOMMENDATION: Ultrasound-guided core needle biopsy x1 of the left breast. I have discussed the findings and recommendations with the patient. If applicable, a reminder letter will be sent to the patient regarding the next appointment. BI-RADS CATEGORY  4: Suspicious. Electronically Signed   By: Inocente Ast M.D.   On: 10/31/2023 11:03   US  LIMITED ULTRASOUND INCLUDING AXILLA RIGHT BREAST Result Date: 10/31/2023 CLINICAL DATA:  68 year old female presenting as a recall from screening for possible right breast mass and possible left breast asymmetry with calcifications. EXAM:  DIGITAL DIAGNOSTIC BILATERAL MAMMOGRAM WITH TOMOSYNTHESIS AND CAD; ULTRASOUND RIGHT BREAST LIMITED; ULTRASOUND LEFT BREAST LIMITED TECHNIQUE: Bilateral digital diagnostic mammography and breast tomosynthesis was performed. The images were evaluated with computer-aided detection. ; Targeted ultrasound examination of the right breast was performed; Targeted ultrasound examination of the left breast was performed. COMPARISON:  Previous exam(s). ACR Breast Density Category b: There are scattered areas of fibroglandular density. FINDINGS: Mammogram: Right breast: A skin BB marks the site of a fluctuating sebaceous cyst reported by the patient. This corresponds to the mass identified mammographically. Left breast: Spot compression tomosynthesis views and spot 2D magnification views were performed demonstrating persistence of an  irregular mass in the upper outer left breast with associated small group of calcifications. Overall this area spans 1.8 cm. On physical exam of the upper-outer left breast I feel a fixed discrete mass. Ultrasound: Targeted ultrasound is performed in the right breast at 3 o'clock 14 cm from the nipple demonstrating an oval circumscribed cystic mass within the skin measuring 1.3 x 0.7 x 1.2 cm, most consistent with a benign sebaceous cyst. Targeted ultrasound performed in the left breast at 2 o'clock 8 cm from the nipple demonstrates an irregular hypoechoic mass measuring 1.7 x 0.9 x 1.7 cm. This corresponds to the mammographic finding. Targeted ultrasound of the left axilla demonstrates normal lymph nodes. IMPRESSION: 1. Suspicious mass with calcifications in the left breast at 2 o'clock measuring 1.7 cm. 2. Benign sebaceous cyst in the right breast at 3 o'clock measuring 1.3 cm. RECOMMENDATION: Ultrasound-guided core needle biopsy x1 of the left breast. I have discussed the findings and recommendations with the patient. If applicable, a reminder letter will be sent to the patient regarding the next appointment. BI-RADS CATEGORY  4: Suspicious. Electronically Signed   By: Inocente Ast M.D.   On: 10/31/2023 11:03   MM 3D SCREENING MAMMOGRAM BILATERAL BREAST Result Date: 10/15/2023 CLINICAL DATA:  Screening. EXAM: DIGITAL SCREENING BILATERAL MAMMOGRAM WITH TOMOSYNTHESIS AND CAD TECHNIQUE: Bilateral screening digital craniocaudal and mediolateral oblique mammograms were obtained. Bilateral screening digital breast tomosynthesis was performed. The images were evaluated with computer-aided detection. COMPARISON:  Previous exam(s). ACR Breast Density Category b: There are scattered areas of fibroglandular density. FINDINGS: In the right breast a possible mass requires further evaluation. In the left breast an asymmetry with calcifications requires further evaluation. IMPRESSION: Further evaluation is suggested  for a possible mass in the right breast. Further evaluation is suggested for an asymmetry with calcifications in the left breast. RECOMMENDATION: Diagnostic mammogram and possibly ultrasound of both breasts. (Code:[id number]) The patient will be contacted regarding the findings, and additional imaging will be scheduled. BI-RADS CATEGORY  0: Incomplete: Need additional imaging evaluation. Electronically Signed   By: Rosaline Collet M.D.   On: 10/15/2023 09:08      IMPRESSION/PLAN: This is a very pleasant 68 year old woman with stage I left breast cancer, ER positive  It was a pleasure meeting the patient today. We discussed the risks, benefits, and side effects of post lumpectomy radiotherapy. I recommend radiotherapy to the left breast to reduce her risk of locoregional recurrence by 2/3.  We discussed that radiation would take approximately 4 weeks to complete and that I would give the patient a few weeks to heal following surgery before starting treatment planning.  If chemotherapy were to be given, this would precede radiotherapy. We spoke about acute effects including skin irritation and fatigue as well as much less common late effects including internal organ injury or irritation. We spoke about  the latest technology that is used to minimize the risk of late effects for patients undergoing radiotherapy to the breast or chest wall. No guarantees of treatment were given. The patient is enthusiastic about proceeding with treatment. I look forward to participating in the patient's care.  I will await her referral back to me for postoperative follow-up and eventual CT simulation/treatment planning.  She works very hard and very long hours.  We talked about ways to communicate with her employer to ensure that she has the medically necessary time off to move forward with treatment.   On date of service, in total, I spent 60 minutes on this encounter. Patient was seen in person.    __________________________________________   Lauraine Golden, MD  This document serves as a record of services personally performed by Lauraine Golden, MD. It was created on her behalf by Dorthy Fuse, a trained medical scribe. The creation of this record is based on the scribe's personal observations and the provider's statements to them. This document has been checked and approved by the attending provider.

## 2023-11-09 ENCOUNTER — Encounter: Payer: Self-pay | Admitting: *Deleted

## 2023-11-09 ENCOUNTER — Inpatient Hospital Stay: Payer: BC Managed Care – PPO | Admitting: Licensed Clinical Social Worker

## 2023-11-09 ENCOUNTER — Other Ambulatory Visit: Payer: Self-pay | Admitting: General Surgery

## 2023-11-09 ENCOUNTER — Inpatient Hospital Stay: Payer: BC Managed Care – PPO | Admitting: Genetic Counselor

## 2023-11-09 ENCOUNTER — Ambulatory Visit: Payer: BC Managed Care – PPO | Attending: General Surgery | Admitting: Physical Therapy

## 2023-11-09 ENCOUNTER — Inpatient Hospital Stay: Payer: BC Managed Care – PPO

## 2023-11-09 ENCOUNTER — Encounter: Payer: Self-pay | Admitting: Physical Therapy

## 2023-11-09 ENCOUNTER — Encounter: Payer: Self-pay | Admitting: Genetic Counselor

## 2023-11-09 ENCOUNTER — Encounter: Payer: Self-pay | Admitting: Radiation Oncology

## 2023-11-09 ENCOUNTER — Other Ambulatory Visit: Payer: Self-pay

## 2023-11-09 ENCOUNTER — Inpatient Hospital Stay: Payer: BC Managed Care – PPO | Attending: Hematology and Oncology | Admitting: Hematology and Oncology

## 2023-11-09 ENCOUNTER — Ambulatory Visit
Admission: RE | Admit: 2023-11-09 | Discharge: 2023-11-09 | Disposition: A | Discharge status: Home / Self Care | Payer: BC Managed Care – PPO | Source: Ambulatory Visit | Attending: Radiation Oncology | Admitting: Radiation Oncology

## 2023-11-09 VITALS — BP 128/70 | HR 79 | Temp 97.3°F | Resp 16 | Wt 217.0 lb

## 2023-11-09 DIAGNOSIS — R293 Abnormal posture: Secondary | ICD-10-CM | POA: Insufficient documentation

## 2023-11-09 DIAGNOSIS — Z87891 Personal history of nicotine dependence: Secondary | ICD-10-CM | POA: Diagnosis not present

## 2023-11-09 DIAGNOSIS — Z170 Estrogen receptor positive status [ER+]: Secondary | ICD-10-CM

## 2023-11-09 DIAGNOSIS — C50412 Malignant neoplasm of upper-outer quadrant of left female breast: Secondary | ICD-10-CM | POA: Diagnosis present

## 2023-11-09 DIAGNOSIS — Z1721 Progesterone receptor positive status: Secondary | ICD-10-CM | POA: Insufficient documentation

## 2023-11-09 DIAGNOSIS — Z803 Family history of malignant neoplasm of breast: Secondary | ICD-10-CM | POA: Diagnosis not present

## 2023-11-09 DIAGNOSIS — Z8052 Family history of malignant neoplasm of bladder: Secondary | ICD-10-CM

## 2023-11-09 DIAGNOSIS — Z1732 Human epidermal growth factor receptor 2 negative status: Secondary | ICD-10-CM | POA: Insufficient documentation

## 2023-11-09 DIAGNOSIS — N6001 Solitary cyst of right breast: Secondary | ICD-10-CM | POA: Diagnosis not present

## 2023-11-09 LAB — CBC WITH DIFFERENTIAL (CANCER CENTER ONLY)
Abs Immature Granulocytes: 0.13 10*3/uL — ABNORMAL HIGH (ref 0.00–0.07)
Basophils Absolute: 0.1 10*3/uL (ref 0.0–0.1)
Basophils Relative: 1 %
Eosinophils Absolute: 0.1 10*3/uL (ref 0.0–0.5)
Eosinophils Relative: 1 %
HCT: 46.4 % — ABNORMAL HIGH (ref 36.0–46.0)
Hemoglobin: 15.6 g/dL — ABNORMAL HIGH (ref 12.0–15.0)
Immature Granulocytes: 1 %
Lymphocytes Relative: 21 %
Lymphs Abs: 2.5 10*3/uL (ref 0.7–4.0)
MCH: 30.1 pg (ref 26.0–34.0)
MCHC: 33.6 g/dL (ref 30.0–36.0)
MCV: 89.4 fL (ref 80.0–100.0)
Monocytes Absolute: 0.8 10*3/uL (ref 0.1–1.0)
Monocytes Relative: 7 %
Neutro Abs: 8.3 10*3/uL — ABNORMAL HIGH (ref 1.7–7.7)
Neutrophils Relative %: 69 %
Platelet Count: 281 10*3/uL (ref 150–400)
RBC: 5.19 MIL/uL — ABNORMAL HIGH (ref 3.87–5.11)
RDW: 13.5 % (ref 11.5–15.5)
WBC Count: 11.8 10*3/uL — ABNORMAL HIGH (ref 4.0–10.5)
nRBC: 0 % (ref 0.0–0.2)

## 2023-11-09 LAB — CMP (CANCER CENTER ONLY)
ALT: 25 U/L (ref 0–44)
AST: 29 U/L (ref 15–41)
Albumin: 4.6 g/dL (ref 3.5–5.0)
Alkaline Phosphatase: 67 U/L (ref 38–126)
Anion gap: 9 (ref 5–15)
BUN: 27 mg/dL — ABNORMAL HIGH (ref 8–23)
CO2: 31 mmol/L (ref 22–32)
Calcium: 11 mg/dL — ABNORMAL HIGH (ref 8.9–10.3)
Chloride: 97 mmol/L — ABNORMAL LOW (ref 98–111)
Creatinine: 0.99 mg/dL (ref 0.44–1.00)
GFR, Estimated: >60 mL/min (ref >60)
Glucose, Bld: 156 mg/dL — ABNORMAL HIGH (ref 70–99)
Potassium: 4.7 mmol/L (ref 3.5–5.1)
Sodium: 137 mmol/L (ref 135–145)
Total Bilirubin: 0.4 mg/dL (ref 0.0–1.2)
Total Protein: 7.9 g/dL (ref 6.5–8.1)

## 2023-11-09 NOTE — Progress Notes (Signed)
 [facility] Clinical Social Work  Initial Assessment   Elizabeth Wood is a 68 y.o. year old female presenting alone. Clinical Social Work was referred by  [facility]  for assessment of psychosocial needs.   SDOH (Social Determinants of Health) assessments performed: Yes SDOH Interventions    Flowsheet Row Clinical Support from 11/09/2023 in [facility] - A Dept Of [place]  SDOH Interventions   Food Insecurity Interventions Intervention Not Indicated  Housing Interventions Intervention Not Indicated  Transportation Interventions Intervention Not Indicated  Utilities Interventions Intervention Not Indicated       SDOH Screenings   Food Insecurity: No Food Insecurity (11/09/2023)  Housing: Low Risk  (11/09/2023)  Transportation Needs: No Transportation Needs (11/09/2023)  Utilities: Not At Risk (11/09/2023)  Depression (PHQ2-9): Low Risk  (11/09/2023)  Tobacco Use: Medium Risk (11/09/2023)     Distress Screen completed: No     No data to display            Family/Social Information:  Housing Arrangement: patient lives with husband, Elizabeth Wood Family members/support persons in your life? Family- husband and 3 adult children (all live in [place] but talk daily) Transportation concerns: no  Employment: Working full time as biomedical engineer for a disabled adult. Typically works Mon-Fri 6:30am-8pm but has plans to cut back hours to accommodate treatment.  Income source: Employment Financial concerns: No Type of concern: None Food access concerns: no Religious or spiritual practice: Not known Services Currently in place:  [employer]  Coping/ Adjustment to diagnosis: Patient understands treatment plan and what happens next? yes, and is already planning on how to alter work schedule to fit treatment. 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 stressed by much Concerns about diagnosis and/or treatment: I'm not especially worried about  anything Current coping skills/ strengths: Ability for insight , Communication skills , Motivation for treatment/growth , and Supportive family/friends     SUMMARY: Current SDOH Barriers:  No major barriers noted today  Clinical Social Work Clinical Goal(s):  No clinical social work goals at this time  Interventions: Discussed common feeling and emotions when being diagnosed with cancer, and the importance of support during treatment Informed patient of the support team roles and support services at [facility] Provided CSW contact information and encouraged patient to call with any questions or concerns   Follow Up Plan: Patient will contact CSW with any support or resource needs Patient verbalizes understanding of plan: Yes    Murvin Gift E Jadriel Saxer, LCSW Clinical Social Worker [facility]

## 2023-11-09 NOTE — Assessment & Plan Note (Signed)
 This is a very pleasant 68 year old postmenopausal female patient with newly diagnosed left breast IDC, grade 3, ER/PR strongly +95%, HER2 negative, Ki-67 of 40% referred to breast MDC for additional recommendations.  Left Breast Cancer IDC 1.7*1.4*1.3 cms, grade III,  Complete surgical resection with no indication for chemotherapy based on oncotype score of 23.Discussed the plan for adjuvant radiation and hormone therapy. - Proceed with radiation therapy as planned with Dr. Basilio Cairo. - Initiate aromatase inhibitor (anastrozole or letrozole) after completion of radiation therapy. - Monitor bone density every 2 years due to potential impact of aromatase inhibitors.  Medication Update Patient has stopped taking oxycodone post-surgery. - Remove oxycodone from medication list.  Follow-up Plan to alternate visits with Dr. Donell Beers to minimize patient travel. - Schedule follow-up visit after completion of radiation therapy and initiation of aromatase inhibitor therapy.

## 2023-11-09 NOTE — Progress Notes (Signed)
 [facility] CONSULT NOTE  Patient Care Team: Chrystal Lamarr RAMAN, MD as PCP - General (Family Medicine) Tyree Nanetta SAILOR, RN as Oncology Nurse Navigator Glean Stephane BROCKS, RN as Oncology Nurse Navigator Aron Shoulders, MD as Consulting Physician (General Surgery) Loretha Ash, MD as Consulting Physician (Hematology and Oncology) Izell Domino, MD as Attending Physician (Radiation Oncology)  CHIEF COMPLAINTS/PURPOSE OF CONSULTATION:  Newly diagnosed breast cancer  HISTORY OF PRESENTING ILLNESS:  Elizabeth Wood 68 y.o. female is here because of recent diagnosis of left breast cancer  I reviewed her records extensively and collaborated the history with the patient.  SUMMARY OF ONCOLOGIC HISTORY: Oncology History  Malignant neoplasm of upper-outer quadrant of left breast in female, estrogen receptor positive (HCC)  10/31/2023 Mammogram   Screening mammogram and diagnostic mammogram showed suspicious mass with calcs in the left breast at 2:00 measuring 1.7 cm.  Benign sebaceous cyst in the right breast at 3:00 measuring 1.3 cm.   10/31/2023 Pathology Results   Left breast needle core biopsy upper outer quadrant at 2:00 8 cm from the nipple confirmed invasive ductal carcinoma, high-grade, ER/PR positive HER2 negative, Ki-67 of 40%   11/07/2023 Initial Diagnosis   Malignant neoplasm of upper-outer quadrant of left breast in female, estrogen receptor positive (HCC)     The patient, with a history of breast cysts, presents after a recent mammogram revealed an abnormality in the left breast at the two o'clock position. A subsequent diagnostic mammogram, ultrasound, and biopsy confirmed the presence of ductal breast cancer.  She arrived to the appointment today by herself.  At baseline she has diabetes and high blood pressure and takes medications for the same.  Rest of the pertinent 10 point ROS reviewed and negative  MEDICAL HISTORY:  Past Medical History:  Diagnosis  Date   Diabetes mellitus without complication (HCC)    Hypertension     SURGICAL HISTORY: Past Surgical History:  Procedure Laterality Date   BREAST BIOPSY Left    benign   BREAST BIOPSY Right    benign   BREAST BIOPSY Left 10/31/2023   US  LT BREAST BX W LOC DEV 1ST LESION IMG BX SPEC US  GUIDE 10/31/2023 [facility]   BREAST EXCISIONAL BIOPSY Left    BREAST EXCISIONAL BIOPSY Right    CHOLECYSTECTOMY     TONSILECTOMY, ADENOIDECTOMY, BILATERAL MYRINGOTOMY AND TUBES      SOCIAL HISTORY: Social History   Socioeconomic History   Marital status: Married    Spouse name: Not on file   Number of children: Not on file   Years of education: Not on file   Highest education level: Not on file  Occupational History   Not on file  Tobacco Use   Smoking status: Former    Types: Cigarettes   Smokeless tobacco: Never  Substance and Sexual Activity   Alcohol use: Never   Drug use: Never   Sexual activity: Not on file  Other Topics Concern   Not on file  Social History Narrative   Not on file   Social Drivers of Health   Financial Resource Strain: Not on file  Food Insecurity: Not on file  Transportation Needs: Not on file  Physical Activity: Not on file  Stress: Not on file  Social Connections: Not on file  Intimate Partner Violence: Not on file    FAMILY HISTORY: No family history on file.  ALLERGIES:  is allergic to lidocaine and novocain [procaine].  MEDICATIONS:  Current Outpatient Medications  Medication Sig Dispense Refill  gabapentin (NEURONTIN) 300 MG capsule Take 300 mg by mouth at bedtime.     OZEMPIC, 1 MG/DOSE, 4 MG/3ML SOPN Inject 1 mg into the skin once a week.     empagliflozin (JARDIANCE) 25 MG TABS tablet 25 mg daily.     glipiZIDE (GLUCOTROL) 5 MG tablet Take 5 mg by mouth daily before breakfast.     lisinopril (ZESTRIL) 20 MG tablet Take 20 mg by mouth daily.     metFORMIN  (GLUCOPHAGE ) 500 MG tablet Take 1.5 tablets (750 mg total) by mouth 3  (three) times daily after meals. 150 tablet 1   metFORMIN  (GLUCOPHAGE ) 500 MG tablet Take 1,000 mg by mouth 2 (two) times daily with a meal.     No current facility-administered medications for this visit.    REVIEW OF SYSTEMS:   Constitutional: Denies fevers, chills or abnormal night sweats Eyes: Denies blurriness of vision, double vision or watery eyes Ears, nose, mouth, throat, and face: Denies mucositis or sore throat Respiratory: Denies cough, dyspnea or wheezes Cardiovascular: Denies palpitation, chest discomfort or lower extremity swelling Gastrointestinal:  Denies nausea, heartburn or change in bowel habits Skin: Denies abnormal skin rashes Lymphatics: Denies new lymphadenopathy or easy bruising Neurological:Denies numbness, tingling or new weaknesses Behavioral/Psych: Mood is stable, no new changes  Breast: Denies any palpable lumps or discharge All other systems were reviewed with the patient and are negative.  PHYSICAL EXAMINATION: ECOG PERFORMANCE STATUS: 0 - Asymptomatic  Vitals:   11/09/23 0842  BP: 128/70  Pulse: 79  Resp: 16  Temp: (!) 97.3 F (36.3 C)  SpO2: 95%   Filed Weights   11/09/23 0842  Weight: 217 lb (98.4 kg)    GENERAL:alert, no distress and comfortable SKIN: skin color, texture, turgor are normal, no rashes or significant lesions EYES: normal, conjunctiva are pink and non-injected, sclera clear OROPHARYNX:no exudate, no erythema and lips, buccal mucosa, and tongue normal  NECK: supple, thyroid normal size, non-tender, without nodularity LYMPH:  no palpable lymphadenopathy in the cervical, axillary or inguinal LUNGS: clear to auscultation and percussion with normal breathing effort HEART: regular rate & rhythm and no murmurs and no lower extremity edema ABDOMEN:abdomen soft, non-tender and normal bowel sounds Musculoskeletal:no cyanosis of digits and no clubbing  PSYCH: alert & oriented x 3 with fluent speech NEURO: no focal motor/sensory  deficits BREAST: No definitive palpable mass in the breast.  No regional adenopathy  LABORATORY DATA:  I have reviewed the data as listed Lab Results  Component Value Date   WBC 9.3 09/10/2019   HGB 15.0 09/10/2019   HCT 44.0 09/10/2019   MCV 87.9 09/10/2019   PLT 261 09/10/2019   Lab Results  Component Value Date   NA 134 (L) 09/10/2019   K 3.7 09/10/2019   CL 97 (L) 09/10/2019   CO2 23 09/10/2019    RADIOGRAPHIC STUDIES: I have personally reviewed the radiological reports and agreed with the findings in the report.  ASSESSMENT AND PLAN:  Malignant neoplasm of upper-outer quadrant of left breast in female, estrogen receptor positive (HCC) This is a very pleasant 67 year old postmenopausal female patient with newly diagnosed left breast IDC, grade 3, ER/PR strongly +95%, HER2 negative, Ki-67 of 40% referred to breast [facility] for additional recommendations.  Left Breast Cancer High grade ductal carcinoma, estrogen and progesterone receptor positive, HER2 negative, KI-67 40%. Discussed the implications of these findings and the potential need for chemotherapy based on the oncotype score. -Plan for surgery and subsequent oncotype analysis. -Prepare for potential chemotherapy  if oncotype score is high. -If oncotype score is low, proceed with radiation and antiestrogen therapy (Anastrozole  or Letrozole).  Breast Cyst Recurrent cyst in the right breast, drains intermittently. -Plan for removal during the surgery for breast cancer.  Bone Health Discussed potential impact of antiestrogen therapy on bone density. -Scheduled bone density scan in May. -Advised to continue walking and take vitamin D and calcium to improve bone density.  Follow-up Post-surgery to discuss oncotype results and further treatment plan.   All questions were answered. The patient knows to call the clinic with any problems, questions or concerns.    Amber Stalls, MD 11/09/23

## 2023-11-09 NOTE — Therapy (Signed)
 OUTPATIENT PHYSICAL THERAPY BREAST CANCER BASELINE EVALUATION   Patient Name: Elizabeth Wood MRN: [id number] DOB:[birth year], 68 y.o., female Today's Date: 11/09/2023  END OF SESSION:  PT End of Session - 11/09/23 1108     Visit Number 1    Number of Visits 2    Date for PT Re-Evaluation 01/04/24    PT Start Time 0954    PT Stop Time 1026    PT Time Calculation (min) 32 min    Activity Tolerance Patient tolerated treatment well    Behavior During Therapy WFL for tasks assessed/performed             Past Medical History:  Diagnosis Date   Diabetes mellitus without complication (HCC)    Hypertension    Past Surgical History:  Procedure Laterality Date   BREAST BIOPSY Left    benign   BREAST BIOPSY Right    benign   BREAST BIOPSY Left 10/31/2023   US  LT BREAST BX W LOC DEV 1ST LESION IMG BX SPEC US  GUIDE 10/31/2023 [facility]   BREAST EXCISIONAL BIOPSY Left    BREAST EXCISIONAL BIOPSY Right    CHOLECYSTECTOMY     TONSILECTOMY, ADENOIDECTOMY, BILATERAL MYRINGOTOMY AND TUBES     Patient Active Problem List   Diagnosis Date Noted   Malignant neoplasm of upper-outer quadrant of left breast in female, estrogen receptor positive (HCC) 11/07/2023    REFERRING PROVIDER: Dr. Jina Nephew  REFERRING DIAG: Left breast cancer  THERAPY DIAG:  Malignant neoplasm of upper-outer quadrant of left breast in female, estrogen receptor positive (HCC)  Abnormal posture  Rationale for Evaluation and Treatment: Rehabilitation  ONSET DATE: 10/13/2023  SUBJECTIVE:                                                                                                                                                                                           SUBJECTIVE STATEMENT: Patient reports she is here today to be seen by her medical team for her newly diagnosed left breast cancer.   PERTINENT HISTORY:  Patient was diagnosed on 10/13/2023 with left grade 3 invasive ductal  carcinoma breast cancer. It measures 1.7 cm and is located in the upper outer quadrant. It is ER/PR positive and HER2 negative with a Ki67 of 40%.   PATIENT GOALS:   reduce lymphedema risk and learn post op HEP.   PAIN:  Are you having pain? No  PRECAUTIONS: Active CA   RED FLAGS: None   HAND DOMINANCE: right  WEIGHT BEARING RESTRICTIONS: No  FALLS:  Has patient fallen in last 6 months? 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  COGNITION: Overall cognitive status: Within functional limits for tasks assessed    POSTURE:  Forward head and rounded shoulders posture  UPPER EXTREMITY AROM/PROM:  A/PROM RIGHT   eval   Shoulder extension 46  Shoulder flexion 149  Shoulder abduction 153  Shoulder internal rotation 71  Shoulder external rotation 70    (Blank rows = not tested)  A/PROM LEFT   eval  Shoulder extension 48  Shoulder flexion 140  Shoulder abduction 156  Shoulder internal rotation 77  Shoulder external rotation 61    (Blank rows = not tested)  CERVICAL AROM: All within normal limits  UPPER EXTREMITY STRENGTH: WFL  LYMPHEDEMA ASSESSMENTS (in cm):   LANDMARK RIGHT   eval  10 cm proximal to olecranon process 32.7  Olecranon process 27.8  10 cm proximal to ulnar styloid process 23.7  Just proximal to ulnar styloid process 17.4  Across hand at thumb web space 19.2  At base of 2nd digit 7.3  (Blank rows = not tested)  LANDMARK LEFT   eval  10 cm proximal to olecranon process 34.4  Olecranon process 28.6  10 cm proximal to ulnar styloid process 23.5  Just proximal to  ulnar styloid process 17.3  Across hand at thumb web space 19.3  At base of 2nd digit 6.6  (Blank rows = not tested)  L-DEX LYMPHEDEMA SCREENING:  The patient was assessed using the L-Dex machine today to produce a lymphedema index baseline score. The patient will be reassessed on a regular basis (typically every 3 months) to obtain new L-Dex scores. If the score is > 6.5 points away from his/her baseline score indicating onset of subclinical lymphedema, it will be recommended to wear a compression garment for 4 weeks, 12 hours per day and then be reassessed. If the score continues to be > 6.5 points from baseline at reassessment, we will initiate lymphedema treatment. Assessing in this manner has a 95% rate of preventing clinically significant lymphedema.   L-DEX FLOWSHEETS - 11/09/23 1100       L-DEX LYMPHEDEMA SCREENING   Measurement Type Unilateral    L-DEX MEASUREMENT EXTREMITY Upper Extremity    POSITION  Standing    DOMINANT SIDE Right    At Risk Side Left    BASELINE SCORE (UNILATERAL) -1.3             QUICK DASH SURVEY:  Elizabeth Wood - 11/09/23 0001     Open a tight or new jar Mild difficulty    Do heavy household chores (wash walls, wash floors) No difficulty    Carry a shopping bag or briefcase No difficulty    Wash your back No difficulty    Use a knife to cut food No difficulty    Recreational activities in which you take some force or impact through your arm, shoulder, or hand (golf, hammering, tennis) No difficulty    During the past week, to what extent has your arm, shoulder or hand problem interfered with your normal social activities with family, friends, neighbors, or groups? Not at all    During the past week, to what extent has your arm, shoulder or hand problem limited  your work or other regular daily activities Not at all    Arm, shoulder, or hand pain. None    Tingling (pins and needles) in your arm, shoulder, or hand None    Difficulty Sleeping No  difficulty    DASH Score 2.27 %              PATIENT EDUCATION:  Education details: Lymphedema risk reduction and post op shoulder/posture HEP Person educated: Patient Education method: Explanation, Demonstration, Handout Education comprehension: Patient verbalized understanding and returned demonstration  HOME EXERCISE PROGRAM: Patient was instructed today in a home exercise program today for post op shoulder range of motion. These included active assist shoulder flexion in sitting, scapular retraction, wall walking with shoulder abduction, and hands behind head external rotation.  She was encouraged to do these twice a day, holding 3 seconds and repeating 5 times when permitted by her physician.   ASSESSMENT:  CLINICAL IMPRESSION: Patient was diagnosed on 10/13/2023 with left grade 3 invasive ductal carcinoma breast cancer. It measures 1.7 cm and is located in the upper outer quadrant. It is ER/PR positive and HER2 negative with a Ki67 of 40%. Her multidisciplinary medical team met prior to her assessments to determine a recommended treatment plan. She is planning to have a left lumpectomy and sentinel node biopsy followed by Oncotype testing, radiation, and anti-estrogen therapy. She will benefit from a post op PT reassessment to determine needs and from L-Dex screens every 3 months for 2 years to detect subclinical lymphedema.  Pt will benefit from skilled therapeutic intervention to improve on the following deficits: Decreased knowledge of precautions, impaired UE functional use, pain, decreased ROM, postural dysfunction.   PT treatment/interventions: ADL/self-care home management, pt/family education, therapeutic exercise  REHAB POTENTIAL: Excellent  CLINICAL DECISION MAKING: Stable/uncomplicated  EVALUATION COMPLEXITY: Low   GOALS: Goals reviewed with patient? YES  LONG TERM GOALS: (STG=LTG)    Name Target Date Goal status  1 Pt will be able to verbalize understanding  of pertinent lymphedema risk reduction practices relevant to her dx specifically related to skin care.  Baseline:  No knowledge 11/09/2023 Achieved at eval  2 Pt will be able to return demo and/or verbalize understanding of the post op HEP related to regaining shoulder ROM. Baseline:  No knowledge 11/09/2023 Achieved at eval  3 Pt will be able to verbalize understanding of the importance of attending the post op After Breast CA Class for further lymphedema risk reduction education and therapeutic exercise.  Baseline:  No knowledge 11/09/2023 Achieved at eval  4 Pt will demo she has regained full shoulder ROM and function post operatively compared to baselines.  Baseline: See objective measurements taken today. 01/04/2024     PLAN:  PT FREQUENCY/DURATION: EVAL and 1 follow up appointment.   PLAN FOR NEXT SESSION: will reassess 3-4 weeks post op to determine needs.   Patient will follow up at outpatient cancer rehab 3-4 weeks following surgery.  If the patient requires physical therapy at that time, a specific plan will be dictated and sent to the referring physician for approval. The patient was educated today on appropriate basic range of motion exercises to begin post operatively and the importance of attending the After Breast Cancer class following surgery.  Patient was educated today on lymphedema risk reduction practices as it pertains to recommendations that will benefit the patient immediately following surgery.  She verbalized good understanding.    Physical Therapy Information for After Breast Cancer Surgery/Treatment:  Lymphedema is a swelling condition  that you may be at risk for in your arm if you have lymph nodes removed from the armpit area.  After a sentinel node biopsy, the risk is approximately 5-9% and is higher after an axillary node dissection.  There is treatment available for this condition and it is not life-threatening.  Contact your physician or physical therapist with  concerns. You may begin the 4 shoulder/posture exercises (see additional sheet) when permitted by your physician (typically a week after surgery).  If you have drains, you may need to wait until those are removed before beginning range of motion exercises.  A general recommendation is to not lift your arms above shoulder height until drains are removed.  These exercises should be done to your tolerance and gently.  This is not a no pain/no gain type of recovery so listen to your body and stretch into the range of motion that you can tolerate, stopping if you have pain.  If you are having immediate reconstruction, ask your plastic surgeon about doing exercises as he or she may want you to wait. We encourage you to attend the free one time ABC (After Breast Cancer) class offered by [facility].  You will learn information related to lymphedema risk, prevention and treatment and additional exercises to regain mobility following surgery.  You can call [phone] for more information.  This is offered the 1st and 3rd Monday of each month.  You only attend the class one time. While undergoing any medical procedure or treatment, try to avoid blood pressure being taken or needle sticks from occurring on the arm on the side of cancer.   This recommendation begins after surgery and continues for the rest of your life.  This may help reduce your risk of getting lymphedema (swelling in your arm). An excellent resource for those seeking information on lymphedema is the National Lymphedema Network's web site. It can be accessed at www.lymphnet.org If you notice swelling in your hand, arm or breast at any time following surgery (even if it is many years from now), please contact your doctor or physical therapist to discuss this.  Lymphedema can be treated at any time but it is easier for you if it is treated early on.  If you feel like your shoulder motion is not returning to normal in a reasonable  amount of time, please contact your surgeon or physical therapist.  [facility] [phone]. [address], [place] [address]  ABC CLASS After Breast Cancer Class  After Breast Cancer Class is a specially designed exercise class to assist you in a safe recover after having breast cancer surgery.  In this class you will learn how to get back to full function whether your drains were just removed or if you had surgery a month ago.  This one-time class is held the 1st and 3rd Monday of every month from 11:00 a.m. until 12:00 noon virtually.  This class is FREE and space is limited. For more information or to register for the next available class, call [phone].  Class Goals  Understand specific stretches to improve the flexibility of you chest and shoulder. Learn ways to safely strengthen your upper body and improve your posture. Understand the warning signs of infection and why you may be at risk for an arm infection. Learn about Lymphedema and prevention.  ** You do not attend this class until after surgery.  Drains must be removed to participate  Patient  was instructed today in a home exercise program today for post op shoulder range of motion. These included active assist shoulder flexion in sitting, scapular retraction, wall walking with shoulder abduction, and hands behind head external rotation.  She was encouraged to do these twice a day, holding 3 seconds and repeating 5 times when permitted by her physician.  Eward Wonda Sharps, [place] 11/09/23 11:20 AM

## 2023-11-09 NOTE — Progress Notes (Signed)
 REFERRING PROVIDER: Loretha Ash, MD  PRIMARY PROVIDER:  Chrystal Lamarr RAMAN, MD  PRIMARY REASON FOR VISIT:  1. Malignant neoplasm of upper-outer quadrant of left breast in female, estrogen receptor positive (HCC)   2. Family history of breast cancer   3. Family history of bladder cancer    HISTORY OF PRESENT ILLNESS:   [name], a 68 y.o. female, was seen for a [place] cancer genetics consultation at the request of Dr. Loretha due to a personal and family history of cancer.  Elizabeth Wood presents to clinic today to discuss the possibility of a hereditary predisposition to cancer, to discuss genetic testing, and to further clarify her future cancer risks, as well as potential cancer risks for family members.   In December 2024, at the age of 68, Ms. Seier was diagnosed with invasive ductal carcinoma of the left breast (ER/PR positive, HER2 negative).  CANCER HISTORY:  Oncology History  Malignant neoplasm of upper-outer quadrant of left breast in female, estrogen receptor positive (HCC)  10/31/2023 Mammogram   Screening mammogram and diagnostic mammogram showed suspicious mass with calcs in the left breast at 2:00 measuring 1.7 cm.  Benign sebaceous cyst in the right breast at 3:00 measuring 1.3 cm.   10/31/2023 Pathology Results   Left breast needle core biopsy upper outer quadrant at 2:00 8 cm from the nipple confirmed invasive ductal carcinoma, high-grade, ER/PR positive HER2 negative, Ki-67 of 40%   11/07/2023 Initial Diagnosis   Malignant neoplasm of upper-outer quadrant of left breast in female, estrogen receptor positive (HCC)    Past Medical History:  Diagnosis Date   Diabetes mellitus without complication (HCC)    Hypertension     Past Surgical History:  Procedure Laterality Date   BREAST BIOPSY Left    benign   BREAST BIOPSY Right    benign   BREAST BIOPSY Left 10/31/2023   US  LT BREAST BX W LOC DEV 1ST LESION IMG BX SPEC US  GUIDE 10/31/2023 [facility]  MAMMOGRAPHY   BREAST EXCISIONAL BIOPSY Left    BREAST EXCISIONAL BIOPSY Right    CHOLECYSTECTOMY     TONSILECTOMY, ADENOIDECTOMY, BILATERAL MYRINGOTOMY AND TUBES      Social History   Socioeconomic History   Marital status: Married    Spouse name: Not on file   Number of children: Not on file   Years of education: Not on file   Highest education level: Not on file  Occupational History   Not on file  Tobacco Use   Smoking status: Former    Types: Cigarettes   Smokeless tobacco: Never  Substance and Sexual Activity   Alcohol use: Never   Drug use: Never   Sexual activity: Not on file  Other Topics Concern   Not on file  Social History Narrative   Not on file   Social Drivers of Health   Financial Resource Strain: Not on file  Food Insecurity: No Food Insecurity (11/09/2023)   Hunger Vital Sign    Worried About Running Out of Food in the Last Year: Never true    Ran Out of Food in the Last Year: Never true  Transportation Needs: No Transportation Needs (11/09/2023)   PRAPARE - Administrator, Civil Service (Medical): No    Lack of Transportation (Non-Medical): No  Physical Activity: Not on file  Stress: Not on file  Social Connections: Not on file     FAMILY HISTORY:  We obtained a detailed, 4-generation family history.  Significant diagnoses are listed  below: Family History  Problem Relation Age of Onset   Prostate cancer Father 67   Breast cancer Maternal Aunt        dx. >50   Breast cancer Maternal Aunt        dx. >50   Breast cancer Maternal Aunt 82   Breast cancer Paternal Aunt        dx. >50   Breast cancer Paternal Aunt        dx. >50   Bladder Cancer Paternal Aunt        dx. >50     Ms. Cobey is unaware of previous family history of genetic testing for hereditary cancer risks. There is no reported Ashkenazi Jewish ancestry.   GENETIC COUNSELING ASSESSMENT: Elizabeth Wood is a 67 y.o. female with a personal and family history of cancer  which is somewhat suggestive of a hereditary predisposition to cancer. We, therefore, discussed and recommended the following at today's visit.   DISCUSSION: We discussed that 5 - 10% of cancer is hereditary, with most cases of breast cancer associated with BRCA1/2.  There are other genes that can be associated with hereditary breast cancer syndromes.  We discussed that testing is beneficial for several reasons including knowing how to follow individuals after completing their treatment, identifying whether potential treatment options would be beneficial, and understanding if other family members could be at risk for cancer and allowing them to undergo genetic testing.   We reviewed the characteristics, features and inheritance patterns of hereditary cancer syndromes. We also discussed genetic testing, including the appropriate family members to test, the process of testing, insurance coverage and turn-around-time for results. We discussed the implications of a negative, positive, carrier and/or variant of uncertain significant result. We recommended Elizabeth Wood pursue genetic testing for a panel that includes genes associated with breast, bladder, and prostate cancer.   Based on Elizabeth Wood's personal and family history of cancer, she meets medical criteria for genetic testing. Despite that she meets criteria, she may still have an out of pocket cost. We discussed that if her out of pocket cost for testing is over $100, the laboratory should contact them to discuss self-pay prices, patient pay assistance programs, if applicable, and other billing options.  PLAN: Despite our recommendation, [name] did not wish to pursue genetic testing at today's visit. She stated that the genetic test results would not impact her surgical decision but she may want to proceed with testing in the future. We understand this decision and remain available to coordinate genetic testing at any time in the future. We,  therefore, recommend Elizabeth Wood continue to follow the cancer screening guidelines given by her primary healthcare provider.  Elizabeth Wood questions were answered to her satisfaction today. Our contact information was provided should additional questions or concerns arise. Thank you for the referral and allowing us  to share in the care of your patient.   Donaven Criswell, MS, [facility] Genetic Counselor [email or web address] (P) [phone]  40 minutes were spent on the date of the encounter in service to the patient including preparation, face-to-face consultation, documentation and care coordination. The patient was seen alone.  Drs. Gudena and/or Lanny were available to discuss this case as needed.   _______________________________________________________________________ For Office Staff:  Number of people involved in session: 1 Was an Intern/ student involved with case: no

## 2023-11-17 ENCOUNTER — Other Ambulatory Visit: Payer: BC Managed Care – PPO

## 2023-11-18 ENCOUNTER — Other Ambulatory Visit: Payer: Self-pay | Admitting: General Surgery

## 2023-11-18 ENCOUNTER — Encounter: Payer: Self-pay | Admitting: *Deleted

## 2023-11-18 ENCOUNTER — Telehealth: Payer: Self-pay | Admitting: *Deleted

## 2023-11-18 NOTE — Telephone Encounter (Signed)
 Spoke with patient to follow up from [facility] and assess navigation needs. Patient denies any questions or concerns at this time.  Encouraged her to call should anything arise.

## 2023-11-21 ENCOUNTER — Encounter: Payer: Self-pay | Admitting: *Deleted

## 2023-11-22 ENCOUNTER — Encounter (HOSPITAL_BASED_OUTPATIENT_CLINIC_OR_DEPARTMENT_OTHER): Payer: Self-pay | Admitting: General Surgery

## 2023-11-22 ENCOUNTER — Encounter: Payer: Self-pay | Admitting: *Deleted

## 2023-11-22 ENCOUNTER — Other Ambulatory Visit: Payer: Self-pay | Admitting: General Surgery

## 2023-11-22 ENCOUNTER — Other Ambulatory Visit: Payer: Self-pay

## 2023-11-22 DIAGNOSIS — C50412 Malignant neoplasm of upper-outer quadrant of left female breast: Secondary | ICD-10-CM

## 2023-11-23 ENCOUNTER — Telehealth: Payer: Self-pay | Admitting: Hematology and Oncology

## 2023-11-23 NOTE — Telephone Encounter (Signed)
Scheduled appointment per scheduling message. Patient is aware of the made appointment and will be mailed an appointment reminder.

## 2023-11-24 ENCOUNTER — Encounter (HOSPITAL_BASED_OUTPATIENT_CLINIC_OR_DEPARTMENT_OTHER)
Admission: RE | Admit: 2023-11-24 | Discharge: 2023-11-24 | Disposition: A | Discharge status: Home / Self Care | Payer: BC Managed Care – PPO | Source: Ambulatory Visit | Attending: General Surgery | Admitting: General Surgery

## 2023-11-24 DIAGNOSIS — R9431 Abnormal electrocardiogram [ECG] [EKG]: Secondary | ICD-10-CM | POA: Insufficient documentation

## 2023-11-24 DIAGNOSIS — I10 Essential (primary) hypertension: Secondary | ICD-10-CM | POA: Insufficient documentation

## 2023-11-24 DIAGNOSIS — Z01810 Encounter for preprocedural cardiovascular examination: Secondary | ICD-10-CM | POA: Diagnosis present

## 2023-11-24 MED ORDER — CHLORHEXIDINE GLUCONATE CLOTH 2 % EX PADS: 6.0000 | MEDICATED_PAD | Freq: Once | CUTANEOUS | Status: DC

## 2023-11-24 NOTE — Progress Notes (Signed)

## 2023-11-28 ENCOUNTER — Ambulatory Visit
Admission: RE | Admit: 2023-11-28 | Discharge: 2023-11-28 | Disposition: A | Discharge status: Home / Self Care | Payer: BC Managed Care – PPO | Source: Ambulatory Visit | Attending: General Surgery | Admitting: General Surgery

## 2023-11-28 ENCOUNTER — Other Ambulatory Visit: Payer: Self-pay | Admitting: General Surgery

## 2023-11-28 DIAGNOSIS — Z170 Estrogen receptor positive status [ER+]: Secondary | ICD-10-CM

## 2023-11-28 HISTORY — PX: BREAST BIOPSY: SHX20

## 2023-11-29 ENCOUNTER — Ambulatory Visit (HOSPITAL_BASED_OUTPATIENT_CLINIC_OR_DEPARTMENT_OTHER)
Admission: RE | Admit: 2023-11-29 | Discharge: 2023-11-29 | Disposition: A | Discharge status: Home / Self Care | Payer: BC Managed Care – PPO | Attending: General Surgery | Admitting: General Surgery

## 2023-11-29 ENCOUNTER — Encounter (HOSPITAL_BASED_OUTPATIENT_CLINIC_OR_DEPARTMENT_OTHER): Payer: Self-pay | Admitting: General Surgery

## 2023-11-29 ENCOUNTER — Other Ambulatory Visit: Payer: Self-pay

## 2023-11-29 ENCOUNTER — Ambulatory Visit
Admission: RE | Admit: 2023-11-29 | Discharge: 2023-11-29 | Disposition: A | Discharge status: Home / Self Care | Payer: BC Managed Care – PPO | Source: Ambulatory Visit | Attending: General Surgery | Admitting: General Surgery

## 2023-11-29 ENCOUNTER — Ambulatory Visit (HOSPITAL_BASED_OUTPATIENT_CLINIC_OR_DEPARTMENT_OTHER): Payer: BC Managed Care – PPO | Admitting: Anesthesiology

## 2023-11-29 ENCOUNTER — Encounter (HOSPITAL_BASED_OUTPATIENT_CLINIC_OR_DEPARTMENT_OTHER)
Admission: RE | Disposition: A | Discharge status: Home / Self Care | Payer: Self-pay | Source: Home / Self Care | Attending: General Surgery

## 2023-11-29 DIAGNOSIS — C50412 Malignant neoplasm of upper-outer quadrant of left female breast: Secondary | ICD-10-CM

## 2023-11-29 DIAGNOSIS — Z170 Estrogen receptor positive status [ER+]: Secondary | ICD-10-CM | POA: Insufficient documentation

## 2023-11-29 DIAGNOSIS — Z7985 Long-term (current) use of injectable non-insulin antidiabetic drugs: Secondary | ICD-10-CM | POA: Insufficient documentation

## 2023-11-29 DIAGNOSIS — N6001 Solitary cyst of right breast: Secondary | ICD-10-CM | POA: Insufficient documentation

## 2023-11-29 DIAGNOSIS — Z7984 Long term (current) use of oral hypoglycemic drugs: Secondary | ICD-10-CM | POA: Insufficient documentation

## 2023-11-29 DIAGNOSIS — I10 Essential (primary) hypertension: Secondary | ICD-10-CM | POA: Insufficient documentation

## 2023-11-29 DIAGNOSIS — E119 Type 2 diabetes mellitus without complications: Secondary | ICD-10-CM | POA: Insufficient documentation

## 2023-11-29 DIAGNOSIS — Z87891 Personal history of nicotine dependence: Secondary | ICD-10-CM | POA: Diagnosis not present

## 2023-11-29 DIAGNOSIS — Z1721 Progesterone receptor positive status: Secondary | ICD-10-CM | POA: Diagnosis not present

## 2023-11-29 DIAGNOSIS — N6310 Unspecified lump in the right breast, unspecified quadrant: Secondary | ICD-10-CM | POA: Diagnosis present

## 2023-11-29 HISTORY — PX: BREAST LUMPECTOMY WITH RADIOACTIVE SEED AND SENTINEL LYMPH NODE BIOPSY: SHX6550

## 2023-11-29 HISTORY — PX: BREAST CYST EXCISION: SHX579

## 2023-11-29 LAB — GLUCOSE, CAPILLARY
Glucose-Capillary: 185 mg/dL — ABNORMAL HIGH (ref 70–99)
Glucose-Capillary: 176 mg/dL — ABNORMAL HIGH (ref 70–99)

## 2023-11-29 SURGERY — BREAST LUMPECTOMY WITH RADIOACTIVE SEED AND SENTINEL LYMPH NODE BIOPSY
Anesthesia: General | Site: Breast | Laterality: Right

## 2023-11-29 MED ORDER — CEFAZOLIN SODIUM-DEXTROSE 2-4 GM/100ML-% IV SOLN
2.0000 g | INTRAVENOUS | Status: AC
  Administered 2023-11-29: 2 g via INTRAVENOUS

## 2023-11-29 MED ORDER — 0.9 % SODIUM CHLORIDE (POUR BTL) OPTIME
TOPICAL | Status: DC | PRN
  Administered 2023-11-29: 75 mL

## 2023-11-29 MED ORDER — DROPERIDOL 2.5 MG/ML IJ SOLN: 0.6250 mg | Freq: Once | INTRAMUSCULAR | Status: DC | PRN

## 2023-11-29 MED ORDER — FENTANYL CITRATE (PF) 100 MCG/2ML IJ SOLN
50.0000 ug | Freq: Once | INTRAMUSCULAR | Status: AC
  Administered 2023-11-29: 100 ug via INTRAVENOUS

## 2023-11-29 MED ORDER — BUPIVACAINE-EPINEPHRINE 0.25% -1:200000 IJ SOLN
INTRAMUSCULAR | Status: DC | PRN
  Administered 2023-11-29: 10 mL

## 2023-11-29 MED ORDER — DEXAMETHASONE SODIUM PHOSPHATE 4 MG/ML IJ SOLN
INTRAMUSCULAR | Status: DC | PRN
  Administered 2023-11-29: 5 mg via INTRAVENOUS

## 2023-11-29 MED ORDER — MAGTRACE LYMPHATIC TRACER
INTRAMUSCULAR | Status: DC | PRN
  Administered 2023-11-29: 2 mL via INTRAMUSCULAR

## 2023-11-29 MED ORDER — FENTANYL CITRATE (PF) 100 MCG/2ML IJ SOLN
INTRAMUSCULAR | Status: AC
  Filled 2023-11-29: qty 2

## 2023-11-29 MED ORDER — MIDAZOLAM HCL 2 MG/2ML IJ SOLN
INTRAMUSCULAR | Status: AC
Start: 2023-11-29 — End: ?
  Filled 2023-11-29: qty 2

## 2023-11-29 MED ORDER — CEFAZOLIN SODIUM-DEXTROSE 2-4 GM/100ML-% IV SOLN
INTRAVENOUS | Status: AC
  Filled 2023-11-29: qty 100

## 2023-11-29 MED ORDER — OXYCODONE HCL 5 MG PO TABS: 5.0000 mg | ORAL_TABLET | Freq: Once | ORAL | Status: DC | PRN

## 2023-11-29 MED ORDER — EPHEDRINE SULFATE (PRESSORS) 50 MG/ML IJ SOLN
INTRAMUSCULAR | Status: DC | PRN
  Administered 2023-11-29 (×2): 10 mg via INTRAVENOUS

## 2023-11-29 MED ORDER — FENTANYL CITRATE (PF) 100 MCG/2ML IJ SOLN: 25.0000 ug | INTRAMUSCULAR | Status: DC | PRN

## 2023-11-29 MED ORDER — PROPOFOL 10 MG/ML IV BOLUS
INTRAVENOUS | Status: DC | PRN
  Administered 2023-11-29: 200 mg via INTRAVENOUS

## 2023-11-29 MED ORDER — MIDAZOLAM HCL 2 MG/2ML IJ SOLN
1.0000 mg | Freq: Once | INTRAMUSCULAR | Status: AC
  Administered 2023-11-29: 2 mg via INTRAVENOUS

## 2023-11-29 MED ORDER — ONDANSETRON HCL 4 MG/2ML IJ SOLN
INTRAMUSCULAR | Status: DC | PRN
  Administered 2023-11-29: 4 mg via INTRAVENOUS

## 2023-11-29 MED ORDER — FENTANYL CITRATE (PF) 100 MCG/2ML IJ SOLN
INTRAMUSCULAR | Status: DC | PRN
  Administered 2023-11-29: 100 ug via INTRAVENOUS

## 2023-11-29 MED ORDER — MIDAZOLAM HCL 5 MG/5ML IJ SOLN
INTRAMUSCULAR | Status: DC | PRN
  Administered 2023-11-29: 1 mg via INTRAVENOUS

## 2023-11-29 MED ORDER — OXYCODONE HCL 5 MG PO TABS: 5.0000 mg | ORAL_TABLET | Freq: Four times a day (QID) | ORAL | 0 refills | Status: DC | PRN

## 2023-11-29 MED ORDER — ACETAMINOPHEN 500 MG PO TABS
1000.0000 mg | ORAL_TABLET | ORAL | Status: AC
  Administered 2023-11-29: 1000 mg via ORAL

## 2023-11-29 MED ORDER — LACTATED RINGERS IV SOLN: INTRAVENOUS | Status: DC

## 2023-11-29 MED ORDER — OXYCODONE HCL 5 MG/5ML PO SOLN: 5.0000 mg | Freq: Once | ORAL | Status: DC | PRN

## 2023-11-29 MED ORDER — SODIUM CHLORIDE 0.9 % IV SOLN: INTRAVENOUS | Status: DC | PRN

## 2023-11-29 MED ORDER — BUPIVACAINE-EPINEPHRINE (PF) 0.25% -1:200000 IJ SOLN
INTRAMUSCULAR | Status: AC
Start: 2023-11-29 — End: ?
  Filled 2023-11-29: qty 30

## 2023-11-29 MED ORDER — ACETAMINOPHEN 500 MG PO TABS
ORAL_TABLET | ORAL | Status: AC
  Filled 2023-11-29: qty 2

## 2023-11-29 SURGICAL SUPPLY — 62 items
BINDER BREAST LRG (GAUZE/BANDAGES/DRESSINGS) IMPLANT
BINDER BREAST MEDIUM (GAUZE/BANDAGES/DRESSINGS) IMPLANT
BINDER BREAST XLRG (GAUZE/BANDAGES/DRESSINGS) IMPLANT
BINDER BREAST XXLRG (GAUZE/BANDAGES/DRESSINGS) IMPLANT
BLADE SURG 10 STRL SS (BLADE) ×2 IMPLANT
BLADE SURG 15 STRL LF DISP TIS (BLADE) ×2 IMPLANT
BNDG COHESIVE 4X5 TAN STRL LF (GAUZE/BANDAGES/DRESSINGS) ×2 IMPLANT
CANISTER SUC SOCK COL 7IN (MISCELLANEOUS) IMPLANT
CANISTER SUCT 1200ML W/VALVE (MISCELLANEOUS) ×2 IMPLANT
CHLORAPREP W/TINT 26 (MISCELLANEOUS) ×2 IMPLANT
CLIP TI LARGE 6 (CLIP) ×2 IMPLANT
CLIP TI MEDIUM 24 (CLIP) IMPLANT
CLIP TI MEDIUM 6 (CLIP) ×4 IMPLANT
CLIP TI WIDE RED SMALL 6 (CLIP) IMPLANT
COVER MAYO STAND STRL (DRAPES) ×4 IMPLANT
COVER PROBE CYLINDRICAL 5X96 (MISCELLANEOUS) ×2 IMPLANT
COVER SURGICAL LIGHT HANDLE (MISCELLANEOUS) IMPLANT
DERMABOND ADVANCED .7 DNX12 (GAUZE/BANDAGES/DRESSINGS) ×2 IMPLANT
DRAPE UTILITY XL STRL (DRAPES) ×2 IMPLANT
ELECT COATED BLADE 2.86 ST (ELECTRODE) ×2 IMPLANT
ELECT REM PT RETURN 9FT ADLT (ELECTROSURGICAL) ×2
ELECTRODE REM PT RTRN 9FT ADLT (ELECTROSURGICAL) ×2 IMPLANT
GAUZE PAD ABD 8X10 STRL (GAUZE/BANDAGES/DRESSINGS) ×2 IMPLANT
GAUZE SPONGE 4X4 12PLY STRL LF (GAUZE/BANDAGES/DRESSINGS) ×2 IMPLANT
GLOVE BIO SURGEON STRL SZ 6 (GLOVE) ×2 IMPLANT
GLOVE BIOGEL PI IND STRL 6.5 (GLOVE) ×2 IMPLANT
GLOVE BIOGEL PI IND STRL 7.0 (GLOVE) IMPLANT
GLOVE BIOGEL PI IND STRL 7.5 (GLOVE) IMPLANT
GLOVE SURG SYN 7.5 E (GLOVE) ×2
GLOVE SURG SYN 7.5 PF PI (GLOVE) IMPLANT
GOWN STRL REUS W/ TWL LRG LVL3 (GOWN DISPOSABLE) ×2 IMPLANT
GOWN STRL REUS W/ TWL XL LVL3 (GOWN DISPOSABLE) ×2 IMPLANT
KIT MARKER MARGIN INK (KITS) ×2 IMPLANT
LIGHT WAVEGUIDE WIDE FLAT (MISCELLANEOUS) IMPLANT
NDL HYPO 25X1 1.5 SAFETY (NEEDLE) ×2 IMPLANT
NDL SAFETY ECLIPSE 18X1.5 (NEEDLE) ×2 IMPLANT
NEEDLE HYPO 25X1 1.5 SAFETY (NEEDLE) ×4
NS IRRIG 1000ML POUR BTL (IV SOLUTION) ×2 IMPLANT
PACK BASIN DAY SURGERY FS (CUSTOM PROCEDURE TRAY) ×2 IMPLANT
PACK UNIVERSAL I (CUSTOM PROCEDURE TRAY) ×2 IMPLANT
PENCIL SMOKE EVACUATOR (MISCELLANEOUS) ×2 IMPLANT
SLEEVE SCD COMPRESS KNEE MED (STOCKING) ×2 IMPLANT
SPIKE FLUID TRANSFER (MISCELLANEOUS) IMPLANT
SPONGE T-LAP 18X18 ~~LOC~~+RFID (SPONGE) ×4 IMPLANT
STAPLER SKIN PROX WIDE 3.9 (STAPLE) IMPLANT
STOCKINETTE IMPERVIOUS LG (DRAPES) ×2 IMPLANT
STRIP CLOSURE SKIN 1/2X4 (GAUZE/BANDAGES/DRESSINGS) ×2 IMPLANT
SUT ETHILON 2 0 FS 18 (SUTURE) IMPLANT
SUT MNCRL AB 4-0 PS2 18 (SUTURE) ×2 IMPLANT
SUT MON AB 5-0 PS2 18 (SUTURE) IMPLANT
SUT SILK 2 0 SH (SUTURE) IMPLANT
SUT VIC AB 2-0 SH 27XBRD (SUTURE) ×2 IMPLANT
SUT VIC AB 3-0 SH 27X BRD (SUTURE) ×2 IMPLANT
SUT VICRYL 3-0 CR8 SH (SUTURE) ×2 IMPLANT
SYR 10ML LL (SYRINGE) IMPLANT
SYR BULB EAR ULCER 3OZ GRN STR (SYRINGE) ×2 IMPLANT
SYR CONTROL 10ML LL (SYRINGE) ×2 IMPLANT
TOWEL GREEN STERILE FF (TOWEL DISPOSABLE) ×2 IMPLANT
TRACER MAGTRACE VIAL (MISCELLANEOUS) IMPLANT
TRAY FAXITRON CT DISP (TRAY / TRAY PROCEDURE) ×2 IMPLANT
TUBE CONNECTING 20X1/4 (TUBING) ×2 IMPLANT
YANKAUER SUCT BULB TIP NO VENT (SUCTIONS) ×2 IMPLANT

## 2023-11-29 NOTE — Anesthesia Preprocedure Evaluation (Addendum)
Anesthesia Evaluation  Patient identified by MRN, date of birth, ID band Patient awake    Reviewed: Allergy & Precautions, NPO status , Patient's Chart, lab work & pertinent test results  History of Anesthesia Complications Negative for: history of anesthetic complications  Airway Mallampati: II  TM Distance: >3 FB Neck ROM: Full    Dental no notable dental hx.    Pulmonary former smoker   Pulmonary exam normal        Cardiovascular hypertension, Pt. on medications Normal cardiovascular exam     Neuro/Psych negative neurological ROS     GI/Hepatic negative GI ROS, Neg liver ROS,,,  Endo/Other  diabetes, Type 2, Oral Hypoglycemic Agents    Renal/GU negative Renal ROS     Musculoskeletal negative musculoskeletal ROS (+)    Abdominal   Peds  Hematology negative hematology ROS (+)   Anesthesia Other Findings Left breast ca  Reproductive/Obstetrics                              Anesthesia Physical Anesthesia Plan  ASA: 2  Anesthesia Plan: General   Post-op Pain Management: Tylenol PO (pre-op)* and Regional block*   Induction: Intravenous  PONV Risk Score and Plan: 3 and Treatment may vary due to age or medical condition, Ondansetron, Dexamethasone and Midazolam  Airway Management Planned: LMA  Additional Equipment: None  Intra-op Plan:   Post-operative Plan: Extubation in OR  Informed Consent: I have reviewed the patients History and Physical, chart, labs and discussed the procedure including the risks, benefits and alternatives for the proposed anesthesia with the patient or authorized representative who has indicated his/her understanding and acceptance.     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 She has tolerated bupivacaine in the past. Plan to use  bupivacaine/Exparel for pec block. Stephannie Peters, MD)        Anesthesia Quick Evaluation

## 2023-11-29 NOTE — Transfer of Care (Signed)
Immediate Anesthesia Transfer of Care Note  Patient: Elizabeth Wood  Procedure(s) Performed: LEFT SEED LOCALIZED LUMPECTOMY AND SENTINEL LYMPH NODE BIOPSY (Left: Breast) CYST EXCISION RIGHT BREAST (Right: Breast)  Patient Location: PACU  Anesthesia Type:GA combined with regional for post-op pain  Level of Consciousness: drowsy and patient cooperative  Airway & Oxygen Therapy: Patient Spontanous Breathing and Patient connected to face mask oxygen  Post-op Assessment: Report given to RN and Post -op Vital signs reviewed and stable  Post vital signs: Reviewed and stable  Last Vitals:  Vitals Value Taken Time  BP 118/64 11/29/23 1254  Temp    Pulse 85 11/29/23 1259  Resp 15 11/29/23 1259  SpO2 99 % 11/29/23 1259  Vitals shown include unfiled device data.  Last Pain:  Vitals:   11/29/23 0810  PainSc: 0-No pain      Patients Stated Pain Goal: 5 (11/29/23 0810)  Complications: No notable events documented.

## 2023-11-29 NOTE — Progress Notes (Signed)
Assisted Dr. Stephannie Peters with left, pectoralis, ultrasound guided block. Side rails up, monitors on throughout procedure. See vital signs in flow sheet. Tolerated Procedure well.

## 2023-11-29 NOTE — Anesthesia Postprocedure Evaluation (Signed)
Anesthesia Post Note  Patient: Kyndel Egger  Procedure(s) Performed: LEFT SEED LOCALIZED LUMPECTOMY AND SENTINEL LYMPH NODE BIOPSY (Left: Breast) CYST EXCISION RIGHT BREAST (Right: Breast)     Patient location during evaluation: PACU Anesthesia Type: General Level of consciousness: awake Pain management: pain level controlled Vital Signs Assessment: post-procedure vital signs reviewed and stable Respiratory status: spontaneous breathing, nonlabored ventilation and respiratory function stable Cardiovascular status: blood pressure returned to baseline and stable Postop Assessment: no apparent nausea or vomiting Anesthetic complications: no   No notable events documented.  Last Vitals:  Vitals:   11/29/23 1315 11/29/23 1330  BP: 123/78 119/71  Pulse: 87 79  Resp: 12 12  Temp:    SpO2: 98% 95%    Last Pain:  Vitals:   11/29/23 1330  PainSc: 0-No pain                 Linton Rump

## 2023-11-29 NOTE — Op Note (Signed)
Left Breast Radioactive seed localized lumpectomy and sentinel lymph node biopsy, excision of right breast mass  Indications: This patient presents with history of left breast cancer, cT1cN0 grade 3 invasive ductal carcinoma, upper outer quadrant, ER+/PR+/Her2-. Also patient has had a superficial right breast mass for multiple years that waxes and wanes in size.    Pre-operative Diagnosis: left breast cancer, right breast mass  Post-operative Diagnosis: left breast cancer, right breast mass  Surgeon: ZOXWRU,EAVWU   Anesthesia: General endotracheal anesthesia  ASA Class: 2  Procedure Details  The patient was seen in the Holding Room. The risks, benefits, complications, treatment options, and expected outcomes were discussed with the patient. The possibilities of bleeding, infection, the need for additional procedures, failure to diagnose a condition, and creating a complication requiring transfusion or operation were discussed with the patient. The patient concurred with the proposed plan, giving informed consent.  The site of surgery properly noted/marked. The patient was taken to Operating Room # 8, identified, and the procedure verified as left Breast Seed localized Lumpectomy with sentinel lymph node biopsy, excision of right breast mass. The left arm, bilateral breast, and chest were prepped and draped in standard fashion. A Time Out was held and the above information confirmed. The MagTrace was injected into the left subareolar position and massaged.  The lumpectomy was performed by creating a curvilinear incision near the previously placed radioactive seed.  Dissection was carried down to around the point of maximum signal intensity. The cautery was used to perform the dissection.   The specimen was inked with the margin marker paint kit.    Specimen radiography confirmed inclusion of the mammographic lesion, the clip, and the seed.  The background signal in the breast was zero.  Additional  margins were taken at the superior and medial margin(s) based on the 3D specimen radiograph. The wound was irrigated and reinspected for hemostasis. Hemostasis was achieved with cautery. The edges of the cavity were marked with large clips.  The skin was then closed with 3-0 vicryl in layers and 4-0 monocryl subcuticular suture.    Using a sentimag probe, left axillary sentinel nodes were identified transcutaneously.  An oblique incision was created below the axillary hairline.  Dissection was carried through the clavipectoral fascia.  Three deep level two axillary sentinel nodes were removed.  Counts per second were 50, 1340, and 200.    The background count was 20 cps.  The wound was irrigated.  Hemostasis was achieved with cautery.  The axillary incision was closed with a 3-0 vicryl deep dermal interrupted sutures and a 4-0 monocryl subcuticular closure.    The right breast was excised with an elliptical incision to incorporate the skin and the mass (clinically epidermal inclusion cyst).  The cautery was used to take the entire mass.  Hemostasis was achieved with cautery.  Skin was closed with a 3-0 vicryl deep dermal interrupted sutures and a 4-0 monocryl subcuticular closure.  Sterile dressings were applied. At the end of the operation, all sponge, instrument, and needle counts were correct.  Findings: grossly clear surgical margins and no adenopathy, posterior margin is pectoralis, anterior margin is skin  Estimated Blood Loss:  min         Specimens: left breast tissue with seed, additional medial and superior margins, and three left axillary sentinel lymph nodes.    Right breast mass         Complications:  None; patient tolerated the procedure well.  Disposition: PACU - hemodynamically stable.         Condition: stable

## 2023-11-29 NOTE — Anesthesia Procedure Notes (Signed)
Anesthesia Regional Block: Pectoralis block   Pre-Anesthetic Checklist: , timeout performed,  Correct Patient, Correct Site, Correct Laterality,  Correct Procedure, Correct Position, site marked,  Risks and benefits discussed,  Pre-op evaluation,  At surgeon's request and post-op pain management  Laterality: Left  Prep: Maximum Sterile Barrier Precautions used, chloraprep       Needles:  Injection technique: Single-shot  Needle Type: Echogenic Stimulator Needle     Needle Length: 9cm  Needle Gauge: 22     Additional Needles:   Procedures:,,,, ultrasound used (permanent image in chart),,    Narrative:  Start time: 11/29/2023 10:04 AM End time: 11/29/2023 10:07 AM Injection made incrementally with aspirations every 5 mL.  Performed by: Personally  Anesthesiologist: Kaylyn Layer, MD  Additional Notes: Risks, benefits, and alternative discussed. Patient gave consent for procedure. Patient prepped and draped in sterile fashion. Sedation administered, patient remains easily responsive to voice. Relevant anatomy identified with ultrasound guidance. Local anesthetic given in 5cc increments with no signs or symptoms of intravascular injection. No pain or paraesthesias with injection. Patient monitored throughout procedure with signs of LAST or immediate complications. Tolerated well. Ultrasound image placed in chart.  Amalia Greenhouse, MD

## 2023-11-29 NOTE — Anesthesia Procedure Notes (Signed)
Procedure Name: LMA Insertion Date/Time: 11/29/2023 11:04 AM  Performed by: Ronnette Hila, CRNAPre-anesthesia Checklist: Patient identified, Emergency Drugs available, Suction available and Patient being monitored Patient Re-evaluated:Patient Re-evaluated prior to induction Oxygen Delivery Method: Circle System Utilized Preoxygenation: Pre-oxygenation with 100% oxygen Induction Type: IV induction Ventilation: Mask ventilation without difficulty LMA: LMA inserted LMA Size: 4.0 Number of attempts: 1 Airway Equipment and Method: bite block Placement Confirmation: positive ETCO2 Tube secured with: Tape Dental Injury: Teeth and Oropharynx as per pre-operative assessment

## 2023-11-29 NOTE — Discharge Instructions (Addendum)
[facility] Office Phone Number [phone]  BREAST BIOPSY/ PARTIAL MASTECTOMY: POST OP INSTRUCTIONS  Always review your discharge instruction sheet given to you by the facility where your surgery was performed.  IF YOU HAVE DISABILITY OR FAMILY LEAVE FORMS, YOU MUST BRING THEM TO THE OFFICE FOR PROCESSING.  DO NOT GIVE THEM TO YOUR DOCTOR.  Take 2 tylenol (acetominophen) three times a day for 3 days.  If you still have pain, add ibuprofen with food in between if able to take this (if you have kidney issues or stomach issues, do not take ibuprofen).  If both of those are not enough, add the narcotic pain pill.  If you find you are needing a lot of this overnight after surgery, call the next morning for a refill.    Prescriptions will not be filled after 5pm or on week-ends. Take your usually prescribed medications unless otherwise directed You should eat very light the first 24 hours after surgery, such as soup, crackers, pudding, etc.  Resume your normal diet the day after surgery. Most patients will experience some swelling and bruising in the breast.  Ice packs and a good support bra will help.  Swelling and bruising can take several days to resolve.  It is common to experience some constipation if taking pain medication after surgery.  Increasing fluid intake and taking a stool softener will usually help or prevent this problem from occurring.  A mild laxative (Milk of Magnesia or Miralax) should be taken according to package directions if there are no bowel movements after 48 hours. Unless discharge instructions indicate otherwise, you may remove your bandages 48 hours after surgery, and you may shower at that time.  You may have steri-strips (small skin tapes) in place directly over the incision.  These strips should be left on the skin at least for for 7-10 days.    ACTIVITIES:  You may resume regular daily activities (gradually increasing) beginning the next day.  Wearing a  good support bra or sports bra (or the breast binder) minimizes pain and swelling.  You may have sexual intercourse when it is comfortable. No heavy lifting for 1-2 weeks (not over around 10 pounds).  You may drive when you no longer are taking prescription pain medication, you can comfortably wear a seatbelt, and you can safely maneuver your car and apply brakes. RETURN TO WORK:  __________3-14 days depending on job. _______________ Elizabeth Wood should see your doctor in the office for a follow-up appointment approximately two weeks after your surgery.  Your doctor's nurse will typically make your follow-up appointment when she calls you with your pathology report.  Expect your pathology report 3-4 business days after your surgery.  You may call to check if you do not hear from [place] after three days.   WHEN TO CALL YOUR DOCTOR: Fever over 101.0 Nausea and/or vomiting. Extreme swelling or bruising. Continued bleeding from incision. Increased pain, redness, or drainage from the incision.  The clinic staff is available to answer your questions during regular business hours.  Please don't hesitate to call and ask to speak to one of the nurses for clinical concerns.  If you have a medical emergency, go to the nearest emergency room or call 911.  A surgeon from [facility] is always on call at the hospital.  For further questions, please visit [email or web address]  Information for Discharge Teaching: EXPAREL (bupivacaine liposome injectable suspension)   Pain relief is important to your recovery. The goal is to control your  pain so you can move easier and return to your normal activities as soon as possible after your procedure. Your physician may use several types of medicines to manage pain, swelling, and more.  Your surgeon or anesthesiologist gave you EXPAREL(bupivacaine) to help control your pain after surgery.  EXPAREL is a local anesthetic designed to release slowly over an extended  period of time to provide pain relief by numbing the tissue around the surgical site. EXPAREL is designed to release pain medication over time and can control pain for up to 72 hours. Depending on how you respond to EXPAREL, you may require less pain medication during your recovery. EXPAREL can help reduce or eliminate the need for opioids during the first few days after surgery when pain relief is needed the most. EXPAREL is not an opioid and is not addictive. It does not cause sleepiness or sedation.   Important! A teal colored band has been placed on your arm with the date, time and amount of EXPAREL you have received. Please leave this armband in place for the full 96 hours following administration, and then you may remove the band. If you return to the hospital for any reason within 96 hours following the administration of EXPAREL, the armband provides important information that your health care providers to know, and alerts them that you have received this anesthetic.    Possible side effects of EXPAREL: Temporary loss of sensation or ability to move in the area where medication was injected. Nausea, vomiting, constipation Rarely, numbness and tingling in your mouth or lips, lightheadedness, or anxiety may occur. Call your doctor right away if you think you may be experiencing any of these sensations, or if you have other questions regarding possible side effects.  Follow all other discharge instructions given to you by your surgeon or nurse. Eat a healthy diet and drink plenty of water or other fluids.     Post Anesthesia Home Care Instructions  Activity: Get plenty of rest for the remainder of the day. A responsible individual must stay with you for 24 hours following the procedure.  For the next 24 hours, DO NOT: -Drive a car -Advertising copywriter -Drink alcoholic beverages -Take any medication unless instructed by your physician -Make any legal decisions or sign important  papers.  Meals: Start with liquid foods such as gelatin or soup. Progress to regular foods as tolerated. Avoid greasy, spicy, heavy foods. If nausea and/or vomiting occur, drink only clear liquids until the nausea and/or vomiting subsides. Call your physician if vomiting continues.  Special Instructions/Symptoms: Your throat may feel dry or sore from the anesthesia or the breathing tube placed in your throat during surgery. If this causes discomfort, gargle with warm salt water. The discomfort should disappear within 24 hours.  If you had a scopolamine patch placed behind your ear for the management of post- operative nausea and/or vomiting:  1. The medication in the patch is effective for 72 hours, after which it should be removed.  Wrap patch in a tissue and discard in the trash. Wash hands thoroughly with soap and water. 2. You may remove the patch earlier than 72 hours if you experience unpleasant side effects which may include dry mouth, dizziness or visual disturbances. 3. Avoid touching the patch. Wash your hands with soap and water after contact with the patch.       Nextdose of tylenol may be taken at 3p

## 2023-11-29 NOTE — H&P (Signed)
REFERRING PHYSICIAN: Hu  PROVIDER: Matthias Hughs, MD  Care Team: Patient Care Team: Garth Bigness, MD as PCP - General (Family Medicine) Matthias Hughs, MD as Consulting Provider (Surgical Oncology) Iruku, Shawn Stall, MD (Hematology and Oncology) Buckner Malta, MD (Radiation Oncology)   MRN: [id number] DOB: [birth year] DATE OF ENCOUNTER: 11/11/2023  Subjective   Chief Complaint: Breast Cancer  History of Present Illness: Elizabeth Wood is a 68 y.o. female who is seen today as an office consultation at the request of Dr. Al Pimple for evaluation of Breast Cancer .   Presents with a new diagnosis of left breast cancer December 2024. The patient had screening detected calcifications and asymmetry in the left breast. Diagnostic imaging was performed. This showed a 7 cm mass with calcifications at 2:00. 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 The axilla was negative on ultrasound. Does not have any family history of breast cancer or other cancers.  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   Diagnostic mammogram:/us: BCG 10/31/23 ACR Breast Density Category b: There are scattered areas of fibroglandular density. FINDINGS: Mammogram: Right breast: A skin BB marks the site of a fluctuating sebaceous cyst reported by the patient. This corresponds to the mass identified mammographically. Left breast: Spot compression tomosynthesis views and spot 2D magnification views were performed demonstrating persistence of an irregular mass in the upper outer left breast with associated small group of calcifications. Overall this area spans 1.8 cm. On physical exam of the upper-outer left breast I feel a fixed discrete mass. Ultrasound: Targeted  ultrasound is performed in the right breast at 3 o'clock 14 cm from the nipple demonstrating an oval circumscribed cystic mass within the skin measuring 1.3 x 0.7 x 1.2 cm, most consistent with a benign sebaceous cyst. Targeted ultrasound performed in the left breast at 2 o'clock 8 cm from the nipple demonstrates an irregular hypoechoic mass measuring 1.7 x 0.9 x 1.7 cm. This corresponds to the mammographic finding. Targeted ultrasound of the left axilla demonstrates normal lymph nodes. IMPRESSION: 1. Suspicious mass with calcifications in the left breast at 2 o'clock measuring 1.7 cm. 2. Benign sebaceous cyst in the right breast at 3 o'clock measuring 1.3 cm. RECOMMENDATION: Ultrasound-guided core needle biopsy x1 of the left breast. I have discussed the findings and recommendations with the patient. If applicable, a reminder letter will be sent to the patient regarding the next appointment. BI-RADS CATEGORY 4: Suspicious.  Pathology core needle biopsy: 10/31/2023 1. Breast, left, needle core biopsy, upper outer, 2 o'clock, 8cmfn :  - INVASIVE DUCTAL CARCINOMA   Receptors: Estrogen Receptor: POSITIVE, 95%, STRONG STAINING INTENSITY Progesterone Receptor: POSITIVE, 95%, STRONG STAINING INTENSITY Proliferation Marker Ki-67: 40%  GROUP 5: HER2 **NEGATIVE**   Review of Systems: A complete review of systems was obtained from the patient. I have reviewed this information and discussed as appropriate with the patient. See HPI as well for other ROS. ROS - otherwise negative.   Medical History: Past Medical History:  Diagnosis Date  Diabetes mellitus without complication (CMS/HHS-HCC)  History of cancer  Hypertension   Patient Active Problem List  Diagnosis  Malignant neoplasm of upper-outer quadrant of left breast in female, estrogen receptor positive (CMS/HHS-HCC)  Type 2 diabetes mellitus without complication, without long-term current use  of insulin (CMS/HHS-HCC)  Mass of  upper inner quadrant of right breast   Past Surgical History:  Procedure Laterality Date  BREAST EXCISIONAL BIOPSY Bilateral  CHOLECYSTECTOMY  LAPAROSCOPIC TUBAL LIGATION  TONSILLECTOMY    Allergies  Allergen Reactions  Lidocaine Anaphylaxis   Current Outpatient Medications on File Prior to Visit  Medication Sig Dispense Refill  cinnamon bark (CINNAMON ORAL) Take by mouth  cyanocobalamin (VITAMIN B12) 1,000 mcg/mL injection Inject into the muscle monthly  empagliflozin (JARDIANCE) 25 mg tablet Take 25 mg by mouth once daily  glipiZIDE (GLUCOTROL) 5 MG tablet Take 5 mg by mouth every morning before breakfast  lisinopriL (ZESTRIL) 20 MG tablet Take 20 mg by mouth once daily  metFORMIN (GLUCOPHAGE) 500 MG tablet Take 500 mg by mouth 2 (two) times daily with meals  multivitamin tablet Take 1 tablet by mouth once daily  semaglutide (OZEMPIC) 1 mg/dose (2 mg/1.5 mL) pen injector Inject subcutaneously once a week   No current facility-administered medications on file prior to visit.   Family History  Problem Relation Age of Onset  Obesity Mother  High blood pressure (Hypertension) Mother  Hyperlipidemia (Elevated cholesterol) Mother  Coronary Artery Disease (Blocked arteries around heart) Mother  Diabetes Mother  Deep vein thrombosis (DVT or abnormal blood clot formation) Mother  Skin cancer Father  High blood pressure (Hypertension) Father  Hyperlipidemia (Elevated cholesterol) Father  Coronary Artery Disease (Blocked arteries around heart) Father  Diabetes Father  Coronary Artery Disease (Blocked arteries around heart) Sister  Skin cancer Brother  Obesity Brother  Diabetes Brother  Coronary Artery Disease (Blocked arteries around heart) Brother    Social History   Tobacco Use  Smoking Status Former  Types: Cigarettes  Start date: 2004  Smokeless Tobacco Never    Social History   Socioeconomic History  Marital status: Married  Tobacco Use  Smoking status: Former   Types: Cigarettes  Start date: 2004  Smokeless tobacco: Never  Substance and Sexual Activity  Alcohol use: Never  Drug use: Never   Objective:   Vitals:  11/09/23 1101  BP: 128/70  Pulse: 79  Resp: 16  Temp: 36.3 C (97.3 F)  Weight: 98.4 kg (217 lb)  Height: 177.8 cm (5\' 10" )   Body mass index is 31.14 kg/m.  Gen: No acute distress. Well nourished and well groomed.  Neurological: Alert and oriented to person, place, and time. Coordination normal.  Head: Normocephalic and atraumatic.  Eyes: Conjunctivae are normal. Pupils are equal, round, and reactive to light. No scleral icterus.  Neck: Normal range of motion. Neck supple. No tracheal deviation or thyromegaly present.  Cardiovascular: Normal rate, regular rhythm, normal heart sounds and intact distal pulses. Exam reveals no gallop and no friction rub. No murmur heard. Breast: right breast with mass in the skin around 3 o'clock that appears to be a sebaceous cyst. Left breast without palpable masses. No LAD, no nipple discharge or nipple retraction. No contour changes. Ptotic and relatively symmetric. Right breast otherwise benign.  Respiratory: Effort normal. No respiratory distress. No chest wall tenderness. Breath sounds normal. No wheezes, rales or rhonchi.  GI: Soft. Bowel sounds are normal. The abdomen is soft and nontender. There is no rebound and no guarding.  Musculoskeletal: Normal range of motion. Extremities are nontender.  Lymphadenopathy: No cervical, preauricular, postauricular or axillary adenopathy is present Skin: Skin is warm and dry. No rash noted. No diaphoresis. No erythema. No pallor. No clubbing, cyanosis, or edema.  Psychiatric: Normal mood and affect. Behavior is normal.  Judgment and thought content normal.   Labs WBCs 11.8k, HCT 46.4 CMET Gluc 156, Ca 11.0  Assessment and Plan:   ICD-10-CM  1. Malignant neoplasm of upper-outer quadrant of left breast in female, estrogen receptor positive  (CMS/HHS-HCC) C50.412  Z17.0   2. Type 2 diabetes mellitus without complication, without long-term current use of insulin (CMS/HHS-HCC) E11.9   3. Mass of upper inner quadrant of right breast N63.12    The new diagnosis of clinical T1c N0 hormone positive left breast cancer. This is amenable to breast conserving surgery. This would be followed by Oncotype, radiation, and antihormonal treatment. The patient is offered genetic testing.  I discussed seed localized lumpectomy with sentinel lymph node biopsy with the patient. We will also plan to excise the/subcutaneous mass on the right breast. This is consistent with a sebaceous cyst. It has enlarged and has periodically drained material.  The surgical procedure was described to the patient. I discussed the incision type and location and that we would need radiology involved pre op to place a seed 1-2 days pre op.   We discussed the risks bleeding, infection, damage to other structures, need for further procedures/surgeries. We discussed the risk of seroma. The patient was advised if the breast has cancer, we may need to go back to surgery for additional tissue to obtain negative margins. The patient was advised that these are the most common complications, but that others can occur as well. I discussed the risk of alteration in breast contour or size. I discussed risk of chronic pain. There are rare instances of heart/lung issues post op as well as blood clots.   They were advised against taking aspirin or other anti-inflammatory agents/blood thinners the week before surgery.   The risks and benefits of the procedure were described to the patient and she wishes to proceed.

## 2023-11-29 NOTE — Interval H&P Note (Signed)
History and Physical Interval Note:  11/29/2023 9:20 AM  Elizabeth Wood  has presented today for surgery, with the diagnosis of LEFT BREAST CANCER.  The various methods of treatment have been discussed with the patient and family. After consideration of risks, benefits and other options for treatment, the patient has consented to  Procedure(s): LEFT SEED LOCALIZED LUMPECTOMY AND SENTINEL LYMPH NODE BIOPSY (Left) as a surgical intervention.  The patient's history has been reviewed, patient examined, no change in status, stable for surgery.  I have reviewed the patient's chart and labs.  Questions were answered to the patient's satisfaction.     Almond Lint

## 2023-11-30 ENCOUNTER — Encounter (HOSPITAL_BASED_OUTPATIENT_CLINIC_OR_DEPARTMENT_OTHER): Payer: Self-pay | Admitting: General Surgery

## 2023-12-01 LAB — SURGICAL PATHOLOGY

## 2023-12-02 ENCOUNTER — Telehealth: Payer: Self-pay | Admitting: *Deleted

## 2023-12-02 ENCOUNTER — Encounter: Payer: Self-pay | Admitting: *Deleted

## 2023-12-02 NOTE — Telephone Encounter (Signed)
Ordered oncotype per Dr. Al Pimple. Requisition sent to pathology and exact sciences

## 2023-12-09 ENCOUNTER — Encounter (HOSPITAL_COMMUNITY): Payer: Self-pay

## 2023-12-14 ENCOUNTER — Telehealth: Payer: Self-pay | Admitting: *Deleted

## 2023-12-14 ENCOUNTER — Encounter: Payer: Self-pay | Admitting: *Deleted

## 2023-12-14 DIAGNOSIS — Z170 Estrogen receptor positive status [ER+]: Secondary | ICD-10-CM

## 2023-12-14 NOTE — Telephone Encounter (Signed)
Received oncotype results of 23/9%. Referral placed for Dr. Basilio Cairo.

## 2023-12-16 NOTE — Progress Notes (Signed)
 Location of Breast Cancer: Malignant Neoplasm of upper-outer quadrant of left breast, estrogen receptor positive.  Histology per Pathology Report:    Receptor Status:  ER(Positive), PR (), Her2-neu (Negative), Ki-67(40%)  Did patient present with symptoms (if so, please note symptoms) or was this found on screening mammography?:  Basilio Cairo, MD 11/09/2023 Mammogram    Past/Anticipated interventions by surgeon, if any: Byerly MD 11/29/2023 Left Seed Localized Lumpectomy and Lymph Node Biopsy Cyst Excision on Right Breast    Past/Anticipated interventions by medical oncology, if any:  Iruku, MD 11/09/2023   Lymphedema issues, if any:  Patient did have a little lymphedema, was encouraged to wear compression bra.  Pain issues, if any:  Patient denies   SAFETY ISSUES: Prior radiation? None Pacemaker/ICD? None Possible current pregnancy? N/A Is the patient on methotrexate? None  Current Complaints / other details:   None

## 2023-12-20 ENCOUNTER — Encounter: Payer: Self-pay | Admitting: *Deleted

## 2023-12-26 ENCOUNTER — Telehealth: Payer: Self-pay

## 2023-12-26 NOTE — Progress Notes (Unsigned)
 [facility] CONSULT NOTE  Patient Care Team: Shon Hale, MD as PCP - General (Family Medicine) Donnelly Angelica, RN as Oncology Nurse Navigator Pershing Proud, RN as Oncology Nurse Navigator Almond Lint, MD as Consulting Physician (General Surgery) Rachel Moulds, MD as Consulting Physician (Hematology and Oncology) Lonie Peak, MD as Attending Physician (Radiation Oncology)  CHIEF COMPLAINTS/PURPOSE OF CONSULTATION:  Newly diagnosed breast cancer  HISTORY OF PRESENTING ILLNESS:  Elizabeth Wood 68 y.o. female is here because of recent diagnosis of left breast cancer  I reviewed her records extensively and collaborated the history with the patient.  SUMMARY OF ONCOLOGIC HISTORY: Oncology History  Malignant neoplasm of upper-outer quadrant of left breast in female, estrogen receptor positive (HCC)  10/31/2023 Mammogram   Screening mammogram and diagnostic mammogram showed suspicious mass with calcs in the left breast at 2:00 measuring 1.7 cm.  Benign sebaceous cyst in the right breast at 3:00 measuring 1.3 cm.   10/31/2023 Pathology Results   Left breast needle core biopsy upper outer quadrant at 2:00 8 cm from the nipple confirmed invasive ductal carcinoma, high-grade, ER/PR positive HER2 negative, Ki-67 of 40%   11/07/2023 Initial Diagnosis   Malignant neoplasm of upper-outer quadrant of left breast in female, estrogen receptor positive (HCC)   11/09/2023 Cancer Staging   Staging form: Breast, AJCC 8th Edition - Clinical stage from 11/09/2023: Stage IA (cT1, cN0, cM0, G3, ER+, PR+, HER2-) - Signed by Rachel Moulds, MD on 12/27/2023 Stage prefix: Initial diagnosis Histologic grading system: 3 grade system Laterality: Left Staged by: Pathologist and managing physician Stage used in treatment planning: Yes National guidelines used in treatment planning: Yes Type of national guideline used in treatment planning: NCCN    Discussed the use of AI  scribe software for clinical note transcription with the patient, who gave verbal consent to proceed.  History of Present Illness    Elizabeth Wood is a 68 year old female with breast cancer who presents for follow-up after surgery. She underwent surgery for breast cancer, and the cancer has been completely removed. Recent tests indicate that chemotherapy is not required as her oncotype score is 23, below the threshold of 26.  She recalls a previous discussion about starting an aromatase inhibitor, which she will receive from her current provider. Options include anastrozole, letrozole, and exemestane, with the choice depending on insurance preferences. The medication is to be taken once daily for a minimum of five years, with the possibility of extending the duration based on risk factors.  She went through menopause ten years ago and experienced significant hot flashes at that time, with occasional hot flashes still occurring. Potential side effects of the medication include postmenopausal symptoms such as hot flashes, vaginal dryness, and initial joint aches, which typically subside.  Regarding her postoperative recovery, she only used two oxycodone pills immediately after surgery. She requests to have oxycodone removed from her medication list as she no longer uses it.  MEDICAL HISTORY:  Past Medical History:  Diagnosis Date   Cancer (HCC) 11/2023   left breast IDC   Diabetes mellitus without complication (HCC)    Hypertension     SURGICAL HISTORY: Past Surgical History:  Procedure Laterality Date   BREAST BIOPSY Left    benign   BREAST BIOPSY Right    benign   BREAST BIOPSY Left 10/31/2023   [place] LT BREAST BX W LOC DEV 1ST LESION IMG BX SPEC US GUIDE 10/31/2023 [facility]MO[facility]   BREAST BIOPSY Left 11/28/2023  [place] LT RADIOACTIVE SEED LOC 11/28/2023 [facility] MAMMOGR[facility]   BREAST CYST EXCISION Right 11/29/2023   Procedure: CYST EXCISION RIGHT BREAST;  Surgeon: Almond Lint, MD;   Location: [facility];  Service: General;  Laterality: Right;   BREAST EXCISIONAL BIOPSY Left    BREAST EXCISIONAL BIOPSY Right    BREAST LUMPECTOMY WITH RADIOACTIVE SEED AND SENTINEL LYMPH NODE BIOPSY Left 11/29/2023   Procedure: LEFT SEED LOCALIZED LUMPECTOMY AND SENTINEL LYMPH NODE BIOPSY;  Surgeon: Almond Lint, MD;  Location:  SURGERY CENTER;  Service: General;  Laterality: Left;   CHOLECYSTECTOMY     TONSILECTOMY, ADENOIDECTOMY, BILATERAL MYRINGOTOMY AND TUBES      SOCIAL HISTORY: Social History   Socioeconomic History   Marital status: Married    Spouse name: Not on file   Number of children: Not on file   Years of education: Not on file   Highest education level: Not on file  Occupational History   Not on file  Tobacco Use   Smoking status: Former    Types: Cigarettes   Smokeless tobacco: Never  Substance and Sexual Activity   Alcohol use: Never   Drug use: Never   Sexual activity: Yes    Birth control/protection: Post-menopausal  Other Topics Concern   Not on file  Social History Narrative   Not on file   Social Drivers of Health   Financial Resource Strain: Not on file  Food Insecurity: No Food Insecurity (11/09/2023)   Hunger Vital Sign    Worried About Running Out of Food in the Last Year: Never true    Ran Out of Food in the Last Year: Never true  Transportation Needs: No Transportation Needs (11/09/2023)   PRAPARE - Administrator, Civil Service (Medical): No    Lack of Transportation (Non-Medical): No  Physical Activity: Not on file  Stress: Not on file  Social Connections: Not on file  Intimate Partner Violence: Not At Risk (11/09/2023)   Humiliation, Afraid, Rape, and Kick questionnaire    Fear of Current or Ex-Partner: No    Emotionally Abused: No    Physically Abused: No    Sexually Abused: No    FAMILY HISTORY: Family History  Problem Relation Age of Onset   Prostate cancer Father 22   Breast cancer Maternal  Aunt        dx. >50   Breast cancer Maternal Aunt        dx. >50   Breast cancer Maternal Aunt 50   Breast cancer Paternal Aunt        dx. >50   Breast cancer Paternal Aunt        dx. >50   Bladder Cancer Paternal Aunt        dx. >50    ALLERGIES:  is allergic to lidocaine and novocain [procaine].  MEDICATIONS:  Current Outpatient Medications  Medication Sig Dispense Refill   Cinnamon Bark POWD by Does not apply route.     empagliflozin (JARDIANCE) 25 MG TABS tablet 25 mg daily.     gabapentin (NEURONTIN) 300 MG capsule Take 300 mg by mouth at bedtime.     glipiZIDE (GLUCOTROL) 5 MG tablet Take 5 mg by mouth daily before breakfast.     lisinopril-hydrochlorothiazide (ZESTORETIC) 20-25 MG tablet Take 1 tablet by mouth daily.     metFORMIN (GLUCOPHAGE) 500 MG tablet Take 1,000 mg by mouth 2 (two) times daily with a meal.     Multiple Vitamin (MULTIVITAMIN WITH MINERALS) TABS tablet  Take 1 tablet by mouth daily.     OZEMPIC, 1 MG/DOSE, 4 MG/3ML SOPN Inject 1 mg into the skin once a week.     Probiotic Product (ALIGN) 4 MG CAPS Take by mouth.     vitamin B-12 (CYANOCOBALAMIN) 500 MCG tablet Take 500 mcg by mouth daily.     No current facility-administered medications for this visit.    REVIEW OF SYSTEMS:   Constitutional: Denies fevers, chills or abnormal night sweats Eyes: Denies blurriness of vision, double vision or watery eyes Ears, nose, mouth, throat, and face: Denies mucositis or sore throat Respiratory: Denies cough, dyspnea or wheezes Cardiovascular: Denies palpitation, chest discomfort or lower extremity swelling Gastrointestinal:  Denies nausea, heartburn or change in bowel habits Skin: Denies abnormal skin rashes Lymphatics: Denies new lymphadenopathy or easy bruising Neurological:Denies numbness, tingling or new weaknesses Behavioral/Psych: Mood is stable, no new changes  Breast: Denies any palpable lumps or discharge All other systems were reviewed with the  patient and are negative.  PHYSICAL EXAMINATION: ECOG PERFORMANCE STATUS: 0 - Asymptomatic  Vitals:   12/27/23 1256  BP: 128/68  Pulse: 95  Resp: 16  Temp: (!) 97.2 F (36.2 C)  SpO2: 96%    Filed Weights   12/27/23 1256  Weight: 214 lb (97.1 kg)     GENERAL:alert, no distress and comfortable SKIN: skin color, texture, turgor are normal, no rashes or significant lesions EYES: normal, conjunctiva are pink and non-injected, sclera clear OROPHARYNX:no exudate, no erythema and lips, buccal mucosa, and tongue normal  NECK: supple, thyroid normal size, non-tender, without nodularity LYMPH:  no palpable lymphadenopathy in the cervical, axillary  LUNGS: clear to auscultation and percussion with normal breathing effort HEART: regular rate & rhythm and no murmurs and no lower extremity edema ABDOMEN:abdomen soft, non-tender and normal bowel sounds Musculoskeletal:no cyanosis of digits and no clubbing  PSYCH: alert & oriented x 3 with fluent speech NEURO: no focal motor/sensory deficits BREAST: No definitive palpable mass in the breast.  No regional adenopathy  LABORATORY DATA:  I have reviewed the data as listed Lab Results  Component Value Date   WBC 11.8 (H) 11/09/2023   HGB 15.6 (H) 11/09/2023   HCT 46.4 (H) 11/09/2023   MCV 89.4 11/09/2023   PLT 281 11/09/2023   Lab Results  Component Value Date   NA 137 11/09/2023   K 4.7 11/09/2023   CL 97 (L) 11/09/2023   CO2 31 11/09/2023    RADIOGRAPHIC STUDIES: I have personally reviewed the radiological reports and agreed with the findings in the report.  ASSESSMENT AND PLAN:  Malignant neoplasm of upper-outer quadrant of left breast in female, estrogen receptor positive (HCC) This is a very pleasant 68 year old postmenopausal female patient with newly diagnosed left breast IDC, grade 3, ER/PR strongly +95%, HER2 negative, Ki-67 of 40% referred to breast MDC for additional recommendations.  Left Breast Cancer IDC  1.7*1.4*1.3 cms, grade III,  Complete surgical resection with no indication for chemotherapy based on oncotype score of 23.Discussed the plan for adjuvant radiation and hormone therapy. - Proceed with radiation therapy as planned with Dr. Basilio Cairo. - Initiate aromatase inhibitor (anastrozole or letrozole) after completion of radiation therapy. - Monitor bone density every 2 years due to potential impact of aromatase inhibitors.  Medication Update Patient has stopped taking oxycodone post-surgery. - Remove oxycodone from medication list.  Follow-up Plan to alternate visits with Dr. Donell Beers to minimize patient travel. - Schedule follow-up visit after completion of radiation therapy and initiation of aromatase  inhibitor therapy.    All questions were answered. The patient knows to call the clinic with any problems, questions or concerns.    Rachel Moulds, MD 12/27/23

## 2023-12-26 NOTE — Assessment & Plan Note (Signed)
 This is a very pleasant 68 year old postmenopausal female patient with newly diagnosed left breast IDC, grade 3, ER/PR strongly +95%, HER2 negative, Ki-67 of 40% referred to breast MDC for additional recommendations.  Left Breast Cancer IDC 1.7*1.4*1.3 cms, grade III,  Complete surgical resection with no indication for chemotherapy based on oncotype score of 23.Discussed the plan for adjuvant radiation and hormone therapy. - Proceed with radiation therapy as planned with Dr. Basilio Cairo. - Initiate aromatase inhibitor (anastrozole or letrozole) after completion of radiation therapy. - Monitor bone density every 2 years due to potential impact of aromatase inhibitors.  Medication Update Patient has stopped taking oxycodone post-surgery. - Remove oxycodone from medication list.  Follow-up Plan to alternate visits with Dr. Donell Beers to minimize patient travel. - Schedule follow-up visit after completion of radiation therapy and initiation of aromatase inhibitor therapy.

## 2023-12-26 NOTE — Telephone Encounter (Signed)
 Spoke with patient and confirmed appointment on 12/27/23

## 2023-12-27 ENCOUNTER — Inpatient Hospital Stay: Payer: Medicare Other | Attending: Hematology and Oncology | Admitting: Hematology and Oncology

## 2023-12-27 DIAGNOSIS — C50412 Malignant neoplasm of upper-outer quadrant of left female breast: Secondary | ICD-10-CM | POA: Diagnosis not present

## 2023-12-27 DIAGNOSIS — Z8052 Family history of malignant neoplasm of bladder: Secondary | ICD-10-CM | POA: Diagnosis not present

## 2023-12-27 DIAGNOSIS — Z803 Family history of malignant neoplasm of breast: Secondary | ICD-10-CM | POA: Insufficient documentation

## 2023-12-27 DIAGNOSIS — Z8042 Family history of malignant neoplasm of prostate: Secondary | ICD-10-CM | POA: Diagnosis not present

## 2023-12-27 DIAGNOSIS — Z1732 Human epidermal growth factor receptor 2 negative status: Secondary | ICD-10-CM | POA: Insufficient documentation

## 2023-12-27 DIAGNOSIS — Z1721 Progesterone receptor positive status: Secondary | ICD-10-CM | POA: Insufficient documentation

## 2023-12-27 DIAGNOSIS — Z170 Estrogen receptor positive status [ER+]: Secondary | ICD-10-CM | POA: Diagnosis not present

## 2023-12-27 DIAGNOSIS — Z87891 Personal history of nicotine dependence: Secondary | ICD-10-CM | POA: Diagnosis not present

## 2023-12-29 ENCOUNTER — Encounter: Payer: Self-pay | Admitting: Radiation Oncology

## 2023-12-29 NOTE — Progress Notes (Signed)
 Radiation Oncology         [phone]) [phone] ________________________________  Name: Elizabeth Wood MRN: [id number]  Date: 12/30/2023  DOB: Nov 05, 1955  Follow-Up Visit Note  Outpatient  CC: Shon Hale, MD  Rachel Moulds, MD  Diagnosis:   No diagnosis found.   Stage IA (cT1, cN0, cM0)  Left Breast UOQ, Invasive Ductal Carcinoma, ER+ / PR+ / Her2-, Grade 3: s/p left breast lumpectomy w/ left axillary SLN excisions   CHIEF COMPLAINT: Here to discuss management of left breast cancer  Narrative:  The patient returns today for follow-up.     Since her breast clinic consultation date of 11/09/23, she opted to proceed with a left breast lumpectomy, left axillary SLN evaluation, along with excision of the right breast cyst on 11/29/23 under the care of Dr. Donell Beers. Pathology from the procedure revealed: tumor size of 1.7 cm in the greatest extent; histology of grade 3 invasive ductal carcinoma; negative for LVI; all margins negative for invasive carcinoma; margin status to invasive disease of 1 mm from the inferior margin; nodal status of 3/3 left axillary SLN excisions negative for carcinoma;  ER status 95% positive and PR status 95% positive, both with strong staining intensity; Proliferation marker Ki67 at 40%; Her2 status negative; Grade 3. The excised right breast mass came back negative for malignancy, and showed findings consistent with an epidermal inclusion cyst.   Oncotype DX was obtained on the final surgical sample and revealed a recurrence score of 23, which predicts a risk of recurrence outside the breast over the next 9 years of 9%, if the patient's only systemic therapy were to be an antiestrogen for 5 years.  It also predicts no benefit from chemotherapy.  Post-operatively, she did develop a small seroma in the left breast and a small axillary seroma. During her most recent post-op visit with Dr. Donell Beers on 02/21, the patient denied any pain associated with either of these  sites, and neither site showed signs of infection.   Based on her most recent visit with Dr. Al Pimple on 12/27/23, she is agreeable to proceed with AI consisting of either anastrozole or letrozole following XRT. As noted above, there is not indication for chemotherapy based on her Oncotype Dx.   Symptomatically, the patient reports: ***        ALLERGIES:  is allergic to lidocaine and novocain [procaine].  Meds: Current Outpatient Medications  Medication Sig Dispense Refill   Cinnamon Bark POWD by Does not apply route.     empagliflozin (JARDIANCE) 25 MG TABS tablet 25 mg daily.     gabapentin (NEURONTIN) 300 MG capsule Take 300 mg by mouth at bedtime.     glipiZIDE (GLUCOTROL) 5 MG tablet Take 5 mg by mouth daily before breakfast.     lisinopril-hydrochlorothiazide (ZESTORETIC) 20-25 MG tablet Take 1 tablet by mouth daily.     metFORMIN (GLUCOPHAGE) 500 MG tablet Take 1,000 mg by mouth 2 (two) times daily with a meal.     Multiple Vitamin (MULTIVITAMIN WITH MINERALS) TABS tablet Take 1 tablet by mouth daily.     OZEMPIC, 1 MG/DOSE, 4 MG/3ML SOPN Inject 1 mg into the skin once a week.     Probiotic Product (ALIGN) 4 MG CAPS Take by mouth.     vitamin B-12 (CYANOCOBALAMIN) 500 MCG tablet Take 500 mcg by mouth daily.     No current facility-administered medications for this encounter.    Physical Findings:  vitals were not taken for this visit. Marland Kitchen  General: Alert and oriented, in no acute distress HEENT: Head is normocephalic. Extraocular movements are intact. Oropharynx is clear. Neck: Neck is supple, no palpable cervical or supraclavicular lymphadenopathy. Heart: Regular in rate and rhythm with no murmurs, rubs, or gallops. Chest: Clear to auscultation bilaterally, with no rhonchi, wheezes, or rales. Abdomen: Soft, nontender, nondistended, with no rigidity or guarding. Extremities: No cyanosis or edema. Lymphatics: see Neck Exam Musculoskeletal: symmetric strength and muscle tone  throughout. Neurologic: No obvious focalities. Speech is fluent.  Psychiatric: Judgment and insight are intact. Affect is appropriate. Breast exam reveals ***  Lab Findings: Lab Results  Component Value Date   WBC 11.8 (H) 11/09/2023   HGB 15.6 (H) 11/09/2023   HCT 46.4 (H) 11/09/2023   MCV 89.4 11/09/2023   PLT 281 11/09/2023    @LASTCHEMISTRY @  Radiographic Findings: No results found.  Impression/Plan: We discussed adjuvant radiotherapy today.  I recommend *** in order to ***.  I reviewed the logistics, benefits, risks, and potential side effects of this treatment in detail. Risks may include but not necessary be limited to acute and late injury tissue in the radiation fields such as skin irritation (change in color/pigmentation, itching, dryness, pain, peeling). She may experience fatigue. We also discussed possible risk of long term cosmetic changes or scar tissue. There is also a smaller risk for lung toxicity, ***cardiac toxicity, ***brachial plexopathy, ***lymphedema, ***musculoskeletal changes, ***rib fragility or ***induction of a second malignancy, ***late chronic non-healing soft tissue wound.    The patient asked good questions which I answered to her satisfaction. She is enthusiastic about proceeding with treatment. A consent form has been *** signed and placed in her chart.  A total of *** medically necessary complex treatment devices will be fabricated and supervised by me: *** fields with MLCs for custom blocks to protect heart, and lungs;  and, a Vac-lok. MORE COMPLEX DEVICES MAY BE MADE IN DOSIMETRY FOR FIELD IN FIELD BEAMS FOR DOSE HOMOGENEITY.  I have requested : 3D Simulation which is medically necessary to give adequate dose to at risk tissues while sparing lungs and heart.  I have requested a DVH of the following structures: lungs, heart, *** lumpectomy cavity.    The patient will receive *** Gy in *** fractions to the *** with *** fields.  This will be *** followed  by a boost.  On date of service, in total, I spent *** minutes on this encounter. Patient was seen in person.  _____________________________________   Lonie Peak, MD  This document serves as a record of services personally performed by Lonie Peak, MD. It was created on her behalf by Neena Rhymes, a trained medical scribe. The creation of this record is based on the scribe's personal observations and the provider's statements to them. This document has been checked and approved by the attending provider.

## 2023-12-30 ENCOUNTER — Ambulatory Visit
Admission: RE | Admit: 2023-12-30 | Discharge: 2023-12-30 | Disposition: A | Discharge status: Home / Self Care | Payer: BC Managed Care – PPO | Source: Ambulatory Visit | Attending: Radiation Oncology | Admitting: Radiation Oncology

## 2023-12-30 VITALS — BP 129/80 | HR 88 | Temp 97.1°F | Resp 18 | Ht 70.0 in | Wt 217.4 lb

## 2023-12-30 DIAGNOSIS — C50412 Malignant neoplasm of upper-outer quadrant of left female breast: Secondary | ICD-10-CM | POA: Insufficient documentation

## 2023-12-30 DIAGNOSIS — Z79899 Other long term (current) drug therapy: Secondary | ICD-10-CM | POA: Insufficient documentation

## 2023-12-30 DIAGNOSIS — Z1721 Progesterone receptor positive status: Secondary | ICD-10-CM | POA: Diagnosis not present

## 2023-12-30 DIAGNOSIS — Z170 Estrogen receptor positive status [ER+]: Secondary | ICD-10-CM | POA: Diagnosis not present

## 2023-12-30 DIAGNOSIS — Z7984 Long term (current) use of oral hypoglycemic drugs: Secondary | ICD-10-CM | POA: Diagnosis not present

## 2023-12-30 DIAGNOSIS — Z1732 Human epidermal growth factor receptor 2 negative status: Secondary | ICD-10-CM | POA: Diagnosis not present

## 2024-01-04 DIAGNOSIS — Z1721 Progesterone receptor positive status: Secondary | ICD-10-CM | POA: Diagnosis not present

## 2024-01-04 DIAGNOSIS — Z1732 Human epidermal growth factor receptor 2 negative status: Secondary | ICD-10-CM | POA: Insufficient documentation

## 2024-01-04 DIAGNOSIS — C50412 Malignant neoplasm of upper-outer quadrant of left female breast: Secondary | ICD-10-CM | POA: Insufficient documentation

## 2024-01-04 DIAGNOSIS — Z170 Estrogen receptor positive status [ER+]: Secondary | ICD-10-CM | POA: Diagnosis not present

## 2024-01-09 ENCOUNTER — Encounter: Payer: Self-pay | Admitting: *Deleted

## 2024-01-09 DIAGNOSIS — C50412 Malignant neoplasm of upper-outer quadrant of left female breast: Secondary | ICD-10-CM

## 2024-01-16 ENCOUNTER — Ambulatory Visit

## 2024-01-16 ENCOUNTER — Ambulatory Visit
Admission: RE | Admit: 2024-01-16 | Discharge: 2024-01-16 | Disposition: A | Discharge status: Home / Self Care | Payer: BC Managed Care – PPO | Source: Ambulatory Visit | Attending: Radiation Oncology | Admitting: Radiation Oncology

## 2024-01-16 ENCOUNTER — Other Ambulatory Visit: Payer: Self-pay

## 2024-01-16 DIAGNOSIS — C50412 Malignant neoplasm of upper-outer quadrant of left female breast: Secondary | ICD-10-CM | POA: Diagnosis not present

## 2024-01-16 LAB — RAD ONC ARIA SESSION SUMMARY
Course Elapsed Days: 0
Plan Fractions Treated to Date: 1
Plan Prescribed Dose Per Fraction: 2.67 Gy
Plan Total Fractions Prescribed: 15
Plan Total Prescribed Dose: 40.05 Gy
Reference Point Dosage Given to Date: 2.67 Gy
Reference Point Session Dosage Given: 2.67 Gy
Session Number: 1

## 2024-01-17 ENCOUNTER — Other Ambulatory Visit: Payer: Self-pay

## 2024-01-17 ENCOUNTER — Ambulatory Visit
Admission: RE | Admit: 2024-01-17 | Discharge: 2024-01-17 | Disposition: A | Discharge status: Home / Self Care | Payer: BC Managed Care – PPO | Source: Ambulatory Visit | Attending: Radiation Oncology

## 2024-01-17 DIAGNOSIS — C50412 Malignant neoplasm of upper-outer quadrant of left female breast: Secondary | ICD-10-CM | POA: Diagnosis not present

## 2024-01-17 LAB — RAD ONC ARIA SESSION SUMMARY
Course Elapsed Days: 1
Plan Fractions Treated to Date: 2
Plan Prescribed Dose Per Fraction: 2.67 Gy
Plan Total Fractions Prescribed: 15
Plan Total Prescribed Dose: 40.05 Gy
Reference Point Dosage Given to Date: 5.34 Gy
Reference Point Session Dosage Given: 2.67 Gy
Session Number: 2

## 2024-01-18 ENCOUNTER — Other Ambulatory Visit: Payer: Self-pay

## 2024-01-18 ENCOUNTER — Ambulatory Visit
Admission: RE | Admit: 2024-01-18 | Discharge: 2024-01-18 | Disposition: A | Discharge status: Home / Self Care | Payer: BC Managed Care – PPO | Source: Ambulatory Visit | Attending: Radiation Oncology

## 2024-01-18 DIAGNOSIS — C50412 Malignant neoplasm of upper-outer quadrant of left female breast: Secondary | ICD-10-CM | POA: Diagnosis not present

## 2024-01-18 LAB — RAD ONC ARIA SESSION SUMMARY
Course Elapsed Days: 2
Plan Fractions Treated to Date: 3
Plan Prescribed Dose Per Fraction: 2.67 Gy
Plan Total Fractions Prescribed: 15
Plan Total Prescribed Dose: 40.05 Gy
Reference Point Dosage Given to Date: 8.01 Gy
Reference Point Session Dosage Given: 2.67 Gy
Session Number: 3

## 2024-01-19 ENCOUNTER — Other Ambulatory Visit: Payer: Self-pay

## 2024-01-19 ENCOUNTER — Ambulatory Visit
Admission: RE | Admit: 2024-01-19 | Discharge: 2024-01-19 | Disposition: A | Discharge status: Home / Self Care | Payer: BC Managed Care – PPO | Source: Ambulatory Visit | Attending: Radiation Oncology | Admitting: Radiation Oncology

## 2024-01-19 DIAGNOSIS — C50412 Malignant neoplasm of upper-outer quadrant of left female breast: Secondary | ICD-10-CM | POA: Diagnosis not present

## 2024-01-19 LAB — RAD ONC ARIA SESSION SUMMARY
Course Elapsed Days: 3
Plan Fractions Treated to Date: 4
Plan Prescribed Dose Per Fraction: 2.67 Gy
Plan Total Fractions Prescribed: 15
Plan Total Prescribed Dose: 40.05 Gy
Reference Point Dosage Given to Date: 10.68 Gy
Reference Point Session Dosage Given: 2.67 Gy
Session Number: 4

## 2024-01-20 ENCOUNTER — Other Ambulatory Visit: Payer: Self-pay

## 2024-01-20 ENCOUNTER — Ambulatory Visit
Admission: RE | Admit: 2024-01-20 | Discharge: 2024-01-20 | Disposition: A | Discharge status: Home / Self Care | Payer: BC Managed Care – PPO | Source: Ambulatory Visit | Attending: Radiation Oncology | Admitting: Radiation Oncology

## 2024-01-20 DIAGNOSIS — C50412 Malignant neoplasm of upper-outer quadrant of left female breast: Secondary | ICD-10-CM | POA: Diagnosis not present

## 2024-01-20 LAB — RAD ONC ARIA SESSION SUMMARY
Course Elapsed Days: 4
Plan Fractions Treated to Date: 5
Plan Prescribed Dose Per Fraction: 2.67 Gy
Plan Total Fractions Prescribed: 15
Plan Total Prescribed Dose: 40.05 Gy
Reference Point Dosage Given to Date: 13.35 Gy
Reference Point Session Dosage Given: 2.67 Gy
Session Number: 5

## 2024-01-23 ENCOUNTER — Ambulatory Visit
Admission: RE | Admit: 2024-01-23 | Discharge: 2024-01-23 | Disposition: A | Discharge status: Home / Self Care | Payer: BC Managed Care – PPO | Source: Ambulatory Visit | Attending: Radiation Oncology | Admitting: Radiation Oncology

## 2024-01-23 ENCOUNTER — Other Ambulatory Visit: Payer: Self-pay

## 2024-01-23 ENCOUNTER — Ambulatory Visit

## 2024-01-23 DIAGNOSIS — C50412 Malignant neoplasm of upper-outer quadrant of left female breast: Secondary | ICD-10-CM | POA: Diagnosis not present

## 2024-01-23 LAB — RAD ONC ARIA SESSION SUMMARY
Course Elapsed Days: 7
Plan Fractions Treated to Date: 6
Plan Prescribed Dose Per Fraction: 2.67 Gy
Plan Total Fractions Prescribed: 15
Plan Total Prescribed Dose: 40.05 Gy
Reference Point Dosage Given to Date: 16.02 Gy
Reference Point Session Dosage Given: 2.67 Gy
Session Number: 6

## 2024-01-24 ENCOUNTER — Ambulatory Visit
Admission: RE | Admit: 2024-01-24 | Discharge: 2024-01-24 | Disposition: A | Discharge status: Home / Self Care | Payer: BC Managed Care – PPO | Source: Ambulatory Visit | Attending: Radiation Oncology | Admitting: Radiation Oncology

## 2024-01-24 ENCOUNTER — Other Ambulatory Visit: Payer: Self-pay

## 2024-01-24 DIAGNOSIS — C50412 Malignant neoplasm of upper-outer quadrant of left female breast: Secondary | ICD-10-CM | POA: Diagnosis not present

## 2024-01-24 LAB — RAD ONC ARIA SESSION SUMMARY
Course Elapsed Days: 8
Plan Fractions Treated to Date: 7
Plan Prescribed Dose Per Fraction: 2.67 Gy
Plan Total Fractions Prescribed: 15
Plan Total Prescribed Dose: 40.05 Gy
Reference Point Dosage Given to Date: 18.69 Gy
Reference Point Session Dosage Given: 2.67 Gy
Session Number: 7

## 2024-01-25 ENCOUNTER — Ambulatory Visit
Admission: RE | Admit: 2024-01-25 | Discharge: 2024-01-25 | Disposition: A | Discharge status: Home / Self Care | Payer: BC Managed Care – PPO | Source: Ambulatory Visit | Attending: Radiation Oncology | Admitting: Radiation Oncology

## 2024-01-25 ENCOUNTER — Other Ambulatory Visit: Payer: Self-pay

## 2024-01-25 DIAGNOSIS — C50412 Malignant neoplasm of upper-outer quadrant of left female breast: Secondary | ICD-10-CM | POA: Diagnosis not present

## 2024-01-25 LAB — RAD ONC ARIA SESSION SUMMARY
Course Elapsed Days: 9
Plan Fractions Treated to Date: 8
Plan Prescribed Dose Per Fraction: 2.67 Gy
Plan Total Fractions Prescribed: 15
Plan Total Prescribed Dose: 40.05 Gy
Reference Point Dosage Given to Date: 21.36 Gy
Reference Point Session Dosage Given: 2.67 Gy
Session Number: 8

## 2024-01-26 ENCOUNTER — Other Ambulatory Visit: Payer: Self-pay

## 2024-01-26 ENCOUNTER — Ambulatory Visit
Admission: RE | Admit: 2024-01-26 | Discharge: 2024-01-26 | Disposition: A | Discharge status: Home / Self Care | Payer: BC Managed Care – PPO | Source: Ambulatory Visit | Attending: Radiation Oncology

## 2024-01-26 DIAGNOSIS — C50412 Malignant neoplasm of upper-outer quadrant of left female breast: Secondary | ICD-10-CM | POA: Diagnosis not present

## 2024-01-26 LAB — RAD ONC ARIA SESSION SUMMARY
Course Elapsed Days: 10
Plan Fractions Treated to Date: 9
Plan Prescribed Dose Per Fraction: 2.67 Gy
Plan Total Fractions Prescribed: 15
Plan Total Prescribed Dose: 40.05 Gy
Reference Point Dosage Given to Date: 24.03 Gy
Reference Point Session Dosage Given: 2.67 Gy
Session Number: 9

## 2024-01-27 ENCOUNTER — Ambulatory Visit
Admission: RE | Admit: 2024-01-27 | Discharge: 2024-01-27 | Disposition: A | Discharge status: Home / Self Care | Payer: BC Managed Care – PPO | Source: Ambulatory Visit | Attending: Radiation Oncology | Admitting: Radiation Oncology

## 2024-01-27 ENCOUNTER — Other Ambulatory Visit: Payer: Self-pay

## 2024-01-27 DIAGNOSIS — C50412 Malignant neoplasm of upper-outer quadrant of left female breast: Secondary | ICD-10-CM | POA: Diagnosis not present

## 2024-01-27 LAB — RAD ONC ARIA SESSION SUMMARY
Course Elapsed Days: 11
Plan Fractions Treated to Date: 10
Plan Prescribed Dose Per Fraction: 2.67 Gy
Plan Total Fractions Prescribed: 15
Plan Total Prescribed Dose: 40.05 Gy
Reference Point Dosage Given to Date: 26.7 Gy
Reference Point Session Dosage Given: 2.67 Gy
Session Number: 10

## 2024-01-28 ENCOUNTER — Telehealth: Payer: Self-pay | Admitting: Hematology and Oncology

## 2024-01-28 NOTE — Telephone Encounter (Signed)
 Spoke with patient confirming upcoming appointment

## 2024-01-30 ENCOUNTER — Ambulatory Visit: Admitting: Radiation Oncology

## 2024-01-30 ENCOUNTER — Other Ambulatory Visit: Payer: Self-pay

## 2024-01-30 ENCOUNTER — Ambulatory Visit: Payer: BC Managed Care – PPO | Admitting: Radiation Oncology

## 2024-01-30 ENCOUNTER — Ambulatory Visit
Admission: RE | Admit: 2024-01-30 | Discharge: 2024-01-30 | Disposition: A | Discharge status: Home / Self Care | Payer: BC Managed Care – PPO | Source: Ambulatory Visit | Attending: Radiation Oncology | Admitting: Radiation Oncology

## 2024-01-30 ENCOUNTER — Ambulatory Visit

## 2024-01-30 DIAGNOSIS — C50412 Malignant neoplasm of upper-outer quadrant of left female breast: Secondary | ICD-10-CM | POA: Diagnosis not present

## 2024-01-30 LAB — RAD ONC ARIA SESSION SUMMARY
Course Elapsed Days: 14
Plan Fractions Treated to Date: 11
Plan Prescribed Dose Per Fraction: 2.67 Gy
Plan Total Fractions Prescribed: 15
Plan Total Prescribed Dose: 40.05 Gy
Reference Point Dosage Given to Date: 29.37 Gy
Reference Point Session Dosage Given: 2.67 Gy
Session Number: 11

## 2024-01-31 ENCOUNTER — Other Ambulatory Visit: Payer: Self-pay

## 2024-01-31 ENCOUNTER — Ambulatory Visit
Admission: RE | Admit: 2024-01-31 | Discharge: 2024-01-31 | Disposition: A | Discharge status: Home / Self Care | Payer: BC Managed Care – PPO | Source: Ambulatory Visit | Attending: Radiation Oncology | Admitting: Radiation Oncology

## 2024-01-31 DIAGNOSIS — Z1732 Human epidermal growth factor receptor 2 negative status: Secondary | ICD-10-CM | POA: Insufficient documentation

## 2024-01-31 DIAGNOSIS — Z1721 Progesterone receptor positive status: Secondary | ICD-10-CM | POA: Diagnosis not present

## 2024-01-31 DIAGNOSIS — C50412 Malignant neoplasm of upper-outer quadrant of left female breast: Secondary | ICD-10-CM | POA: Insufficient documentation

## 2024-01-31 DIAGNOSIS — Z170 Estrogen receptor positive status [ER+]: Secondary | ICD-10-CM | POA: Insufficient documentation

## 2024-01-31 LAB — RAD ONC ARIA SESSION SUMMARY
Course Elapsed Days: 15
Plan Fractions Treated to Date: 12
Plan Prescribed Dose Per Fraction: 2.67 Gy
Plan Total Fractions Prescribed: 15
Plan Total Prescribed Dose: 40.05 Gy
Reference Point Dosage Given to Date: 32.04 Gy
Reference Point Session Dosage Given: 2.67 Gy
Session Number: 12

## 2024-02-01 ENCOUNTER — Ambulatory Visit
Admission: RE | Admit: 2024-02-01 | Discharge: 2024-02-01 | Disposition: A | Discharge status: Home / Self Care | Payer: BC Managed Care – PPO | Source: Ambulatory Visit | Attending: Radiation Oncology | Admitting: Radiation Oncology

## 2024-02-01 ENCOUNTER — Other Ambulatory Visit: Payer: Self-pay

## 2024-02-01 DIAGNOSIS — C50412 Malignant neoplasm of upper-outer quadrant of left female breast: Secondary | ICD-10-CM | POA: Diagnosis not present

## 2024-02-01 LAB — RAD ONC ARIA SESSION SUMMARY
Course Elapsed Days: 16
Plan Fractions Treated to Date: 13
Plan Prescribed Dose Per Fraction: 2.67 Gy
Plan Total Fractions Prescribed: 15
Plan Total Prescribed Dose: 40.05 Gy
Reference Point Dosage Given to Date: 34.71 Gy
Reference Point Session Dosage Given: 2.67 Gy
Session Number: 13

## 2024-02-02 ENCOUNTER — Ambulatory Visit
Admission: RE | Admit: 2024-02-02 | Discharge: 2024-02-02 | Disposition: A | Discharge status: Home / Self Care | Payer: BC Managed Care – PPO | Source: Ambulatory Visit | Attending: Radiation Oncology

## 2024-02-02 ENCOUNTER — Other Ambulatory Visit: Payer: Self-pay

## 2024-02-02 DIAGNOSIS — C50412 Malignant neoplasm of upper-outer quadrant of left female breast: Secondary | ICD-10-CM | POA: Diagnosis not present

## 2024-02-02 LAB — RAD ONC ARIA SESSION SUMMARY
Course Elapsed Days: 17
Plan Fractions Treated to Date: 14
Plan Prescribed Dose Per Fraction: 2.67 Gy
Plan Total Fractions Prescribed: 15
Plan Total Prescribed Dose: 40.05 Gy
Reference Point Dosage Given to Date: 37.38 Gy
Reference Point Session Dosage Given: 2.67 Gy
Session Number: 14

## 2024-02-03 ENCOUNTER — Ambulatory Visit
Admission: RE | Admit: 2024-02-03 | Discharge: 2024-02-03 | Disposition: A | Discharge status: Home / Self Care | Payer: BC Managed Care – PPO | Source: Ambulatory Visit | Attending: Radiation Oncology | Admitting: Radiation Oncology

## 2024-02-03 ENCOUNTER — Other Ambulatory Visit: Payer: Self-pay

## 2024-02-03 DIAGNOSIS — C50412 Malignant neoplasm of upper-outer quadrant of left female breast: Secondary | ICD-10-CM | POA: Diagnosis not present

## 2024-02-03 LAB — RAD ONC ARIA SESSION SUMMARY
Course Elapsed Days: 18
Plan Fractions Treated to Date: 15
Plan Prescribed Dose Per Fraction: 2.67 Gy
Plan Total Fractions Prescribed: 15
Plan Total Prescribed Dose: 40.05 Gy
Reference Point Dosage Given to Date: 40.05 Gy
Reference Point Session Dosage Given: 2.67 Gy
Session Number: 15

## 2024-02-06 ENCOUNTER — Other Ambulatory Visit: Payer: Self-pay

## 2024-02-06 ENCOUNTER — Ambulatory Visit
Admission: RE | Admit: 2024-02-06 | Discharge: 2024-02-06 | Disposition: A | Discharge status: Home / Self Care | Payer: BC Managed Care – PPO | Source: Ambulatory Visit | Attending: Radiation Oncology

## 2024-02-06 ENCOUNTER — Ambulatory Visit
Admission: RE | Admit: 2024-02-06 | Discharge: 2024-02-06 | Disposition: A | Discharge status: Home / Self Care | Source: Ambulatory Visit | Attending: Radiation Oncology | Admitting: Radiation Oncology

## 2024-02-06 DIAGNOSIS — C50412 Malignant neoplasm of upper-outer quadrant of left female breast: Secondary | ICD-10-CM | POA: Diagnosis not present

## 2024-02-06 LAB — RAD ONC ARIA SESSION SUMMARY
Course Elapsed Days: 21
Plan Fractions Treated to Date: 1
Plan Prescribed Dose Per Fraction: 2 Gy
Plan Total Fractions Prescribed: 5
Plan Total Prescribed Dose: 10 Gy
Reference Point Dosage Given to Date: 2 Gy
Reference Point Session Dosage Given: 2 Gy
Session Number: 16

## 2024-02-07 ENCOUNTER — Other Ambulatory Visit: Payer: Self-pay

## 2024-02-07 ENCOUNTER — Ambulatory Visit
Admission: RE | Admit: 2024-02-07 | Discharge: 2024-02-07 | Disposition: A | Discharge status: Home / Self Care | Payer: BC Managed Care – PPO | Source: Ambulatory Visit | Attending: Radiation Oncology

## 2024-02-07 DIAGNOSIS — C50412 Malignant neoplasm of upper-outer quadrant of left female breast: Secondary | ICD-10-CM | POA: Diagnosis not present

## 2024-02-07 LAB — RAD ONC ARIA SESSION SUMMARY
Course Elapsed Days: 22
Plan Fractions Treated to Date: 2
Plan Prescribed Dose Per Fraction: 2 Gy
Plan Total Fractions Prescribed: 5
Plan Total Prescribed Dose: 10 Gy
Reference Point Dosage Given to Date: 4 Gy
Reference Point Session Dosage Given: 2 Gy
Session Number: 17

## 2024-02-08 ENCOUNTER — Other Ambulatory Visit: Payer: Self-pay

## 2024-02-08 ENCOUNTER — Ambulatory Visit
Admission: RE | Admit: 2024-02-08 | Discharge: 2024-02-08 | Disposition: A | Discharge status: Home / Self Care | Payer: BC Managed Care – PPO | Source: Ambulatory Visit | Attending: Radiation Oncology

## 2024-02-08 DIAGNOSIS — C50412 Malignant neoplasm of upper-outer quadrant of left female breast: Secondary | ICD-10-CM | POA: Diagnosis not present

## 2024-02-08 LAB — RAD ONC ARIA SESSION SUMMARY
Course Elapsed Days: 23
Plan Fractions Treated to Date: 3
Plan Prescribed Dose Per Fraction: 2 Gy
Plan Total Fractions Prescribed: 5
Plan Total Prescribed Dose: 10 Gy
Reference Point Dosage Given to Date: 6 Gy
Reference Point Session Dosage Given: 2 Gy
Session Number: 18

## 2024-02-09 ENCOUNTER — Other Ambulatory Visit: Payer: Self-pay

## 2024-02-09 ENCOUNTER — Ambulatory Visit: Payer: BC Managed Care – PPO

## 2024-02-09 DIAGNOSIS — C50412 Malignant neoplasm of upper-outer quadrant of left female breast: Secondary | ICD-10-CM | POA: Diagnosis not present

## 2024-02-09 LAB — RAD ONC ARIA SESSION SUMMARY
Course Elapsed Days: 24
Plan Fractions Treated to Date: 4
Plan Prescribed Dose Per Fraction: 2 Gy
Plan Total Fractions Prescribed: 5
Plan Total Prescribed Dose: 10 Gy
Reference Point Dosage Given to Date: 8 Gy
Reference Point Session Dosage Given: 2 Gy
Session Number: 19

## 2024-02-10 ENCOUNTER — Other Ambulatory Visit: Payer: Self-pay

## 2024-02-10 ENCOUNTER — Ambulatory Visit
Admission: RE | Admit: 2024-02-10 | Discharge: 2024-02-10 | Disposition: A | Discharge status: Home / Self Care | Payer: BC Managed Care – PPO | Source: Ambulatory Visit | Attending: Radiation Oncology | Admitting: Radiation Oncology

## 2024-02-10 DIAGNOSIS — C50412 Malignant neoplasm of upper-outer quadrant of left female breast: Secondary | ICD-10-CM | POA: Diagnosis not present

## 2024-02-10 LAB — RAD ONC ARIA SESSION SUMMARY
Course Elapsed Days: 25
Plan Fractions Treated to Date: 5
Plan Prescribed Dose Per Fraction: 2 Gy
Plan Total Fractions Prescribed: 5
Plan Total Prescribed Dose: 10 Gy
Reference Point Dosage Given to Date: 10 Gy
Reference Point Session Dosage Given: 2 Gy
Session Number: 20

## 2024-02-13 NOTE — Radiation Completion Notes (Signed)
 Patient Name: Elizabeth Wood, Elizabeth Wood MRN: [id number] Date of Birth: 06-Feb-1956 Referring Physician: KATHRYN TIMBERLAKE, M.D. Date of Service: 2024-02-13 Radiation Oncologist: Colie Dawes, M.D. [facility] [place]                             RADIATION ONCOLOGY END OF TREATMENT NOTE     Diagnosis: C50.412 Malignant neoplasm of upper-outer quadrant of left female breast Staging on 2023-11-09: Malignant neoplasm of upper-outer quadrant of left breast in female, estrogen receptor positive (HCC) T=cT1, N=cN0, M=cM0 Intent: Curative     ==========DELIVERED PLANS==========  First Treatment Date: 2024-01-16 Last Treatment Date: 2024-02-10   Plan Name: Breast_L_BH Site: Breast, Left Technique: 3D Mode: Photon Dose Per Fraction: 2.67 Gy Prescribed Dose (Delivered / Prescribed): 40.05 Gy / 40.05 Gy Prescribed Fxs (Delivered / Prescribed): 15 / 15   Plan Name: Brst_L_Bst_BH Site: Breast, Left Technique: 3D Mode: Photon Dose Per Fraction: 2 Gy Prescribed Dose (Delivered / Prescribed): 10 Gy / 10 Gy Prescribed Fxs (Delivered / Prescribed): 5 / 5     ==========ON TREATMENT VISIT DATES========== 2024-01-16, 2024-01-23, 2024-01-30, 2024-02-06     ==========UPCOMING VISITS========== 03/13/2024 [facility] DIAGNOSTIC DG DEXA [facility] DX DEXA 1  03/08/2024 [facility]-RADIATION ONC FOLLOW UP 20 Colie Dawes, MD  02/28/2024 [facility]-MED ONCOLOGY EST PT 15 Murleen Arms, MD        ==========APPENDIX - ON TREATMENT VISIT NOTES==========   See weekly On Treatment Notes in Epic for details in the Media tab (listed as Progress notes on the On Treatment Visit Dates listed above).

## 2024-02-20 IMAGING — MG MM DIGITAL SCREENING BILAT W/ TOMO AND CAD
6 of 10 series · 6 of 30 positions shown · non-contrast
Comparison: Previous exam(s).

CLINICAL DATA: Screening.

EXAM:
DIGITAL SCREENING BILATERAL MAMMOGRAM WITH TOMOSYNTHESIS AND CAD
TECHNIQUE: Bilateral screening digital craniocaudal and mediolateral oblique
mammograms were obtained. Bilateral screening digital breast
tomosynthesis was performed. The images were evaluated with
computer-aided detection.

[L CC synth-2D]
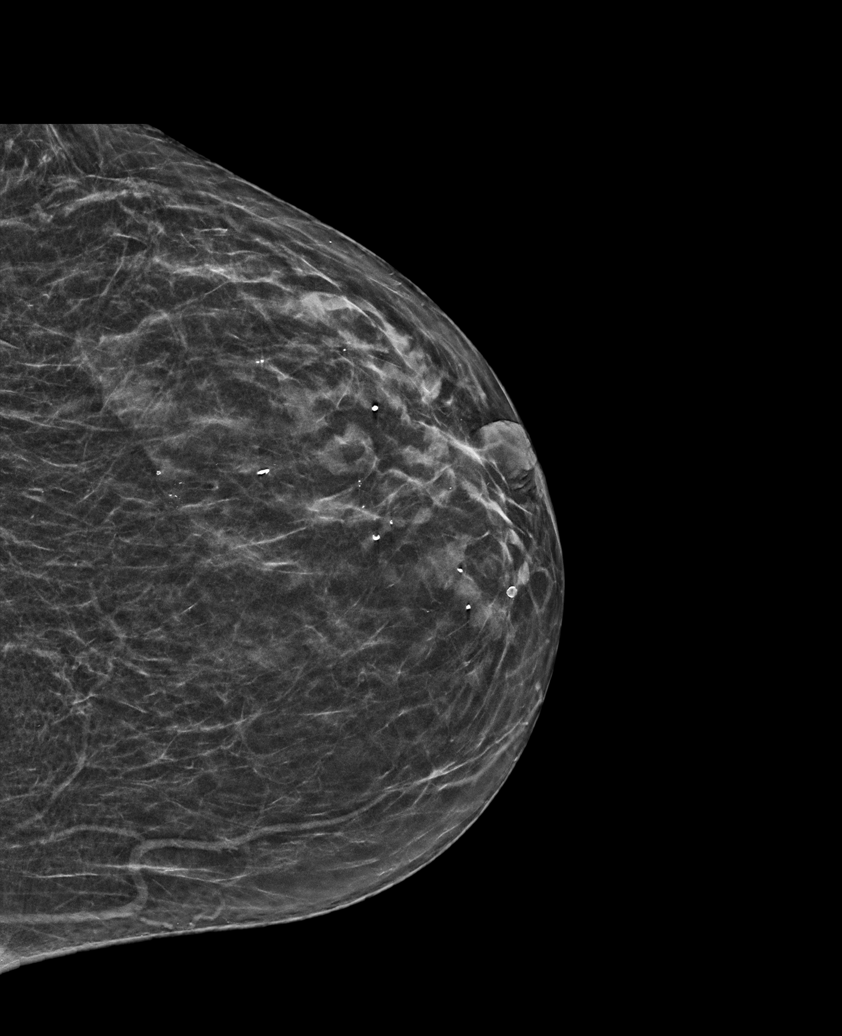

[L MLO synth-2D (1 of 2)]
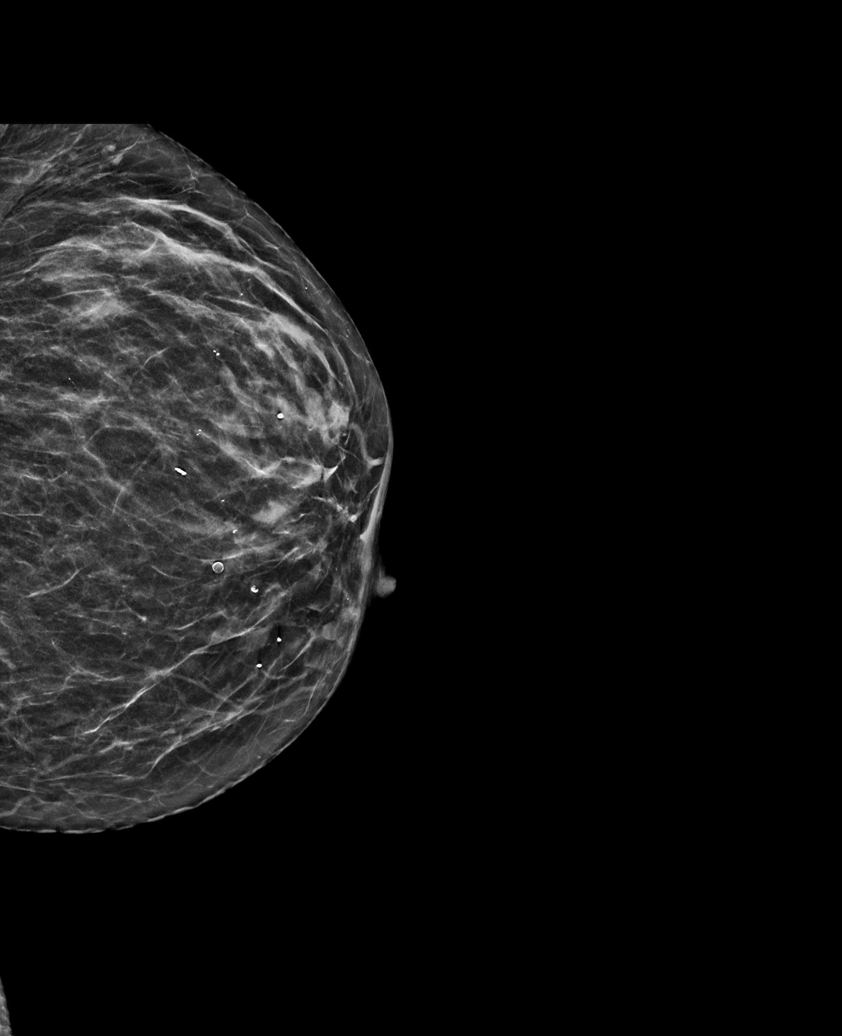

[R CC synth-2D]
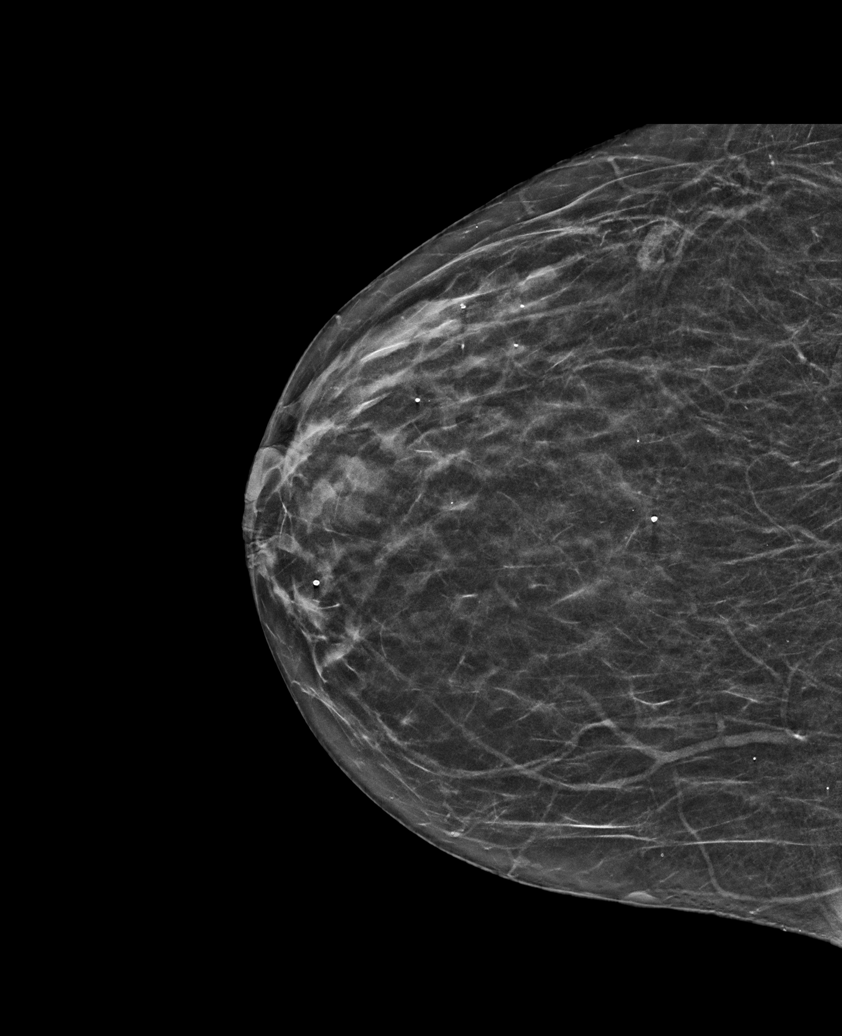

[L MLO synth-2D (2 of 2)]
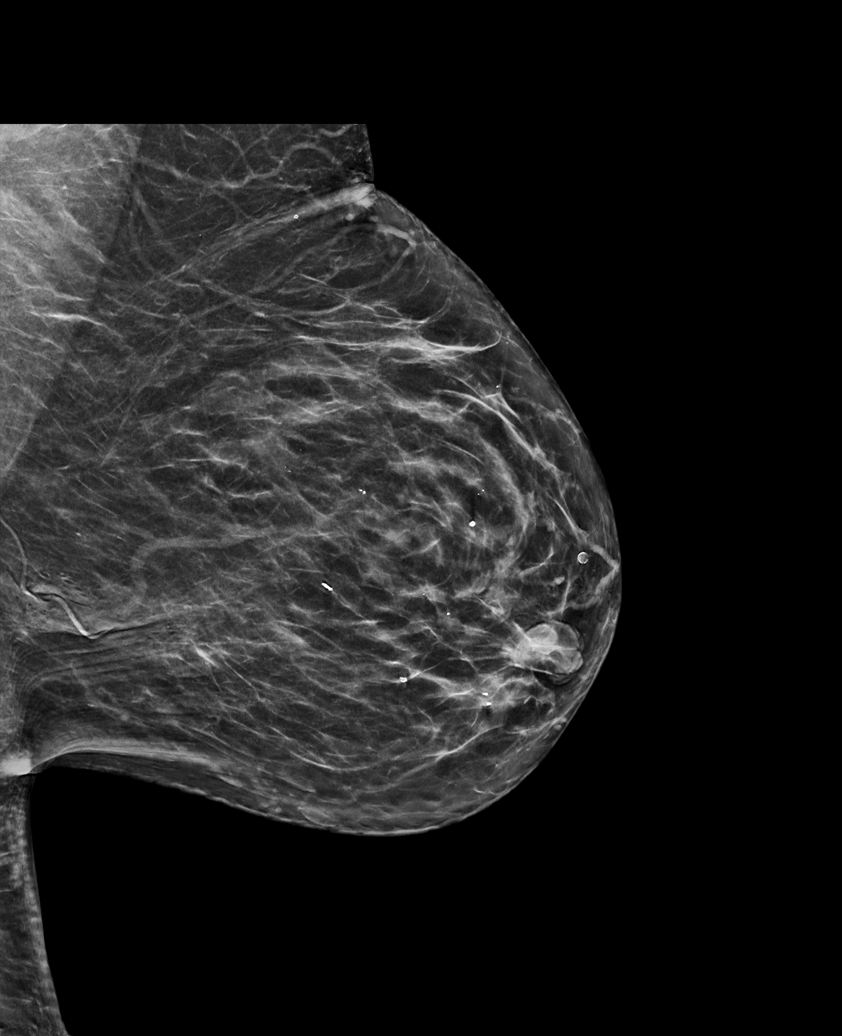

[R MLO synth-2D]
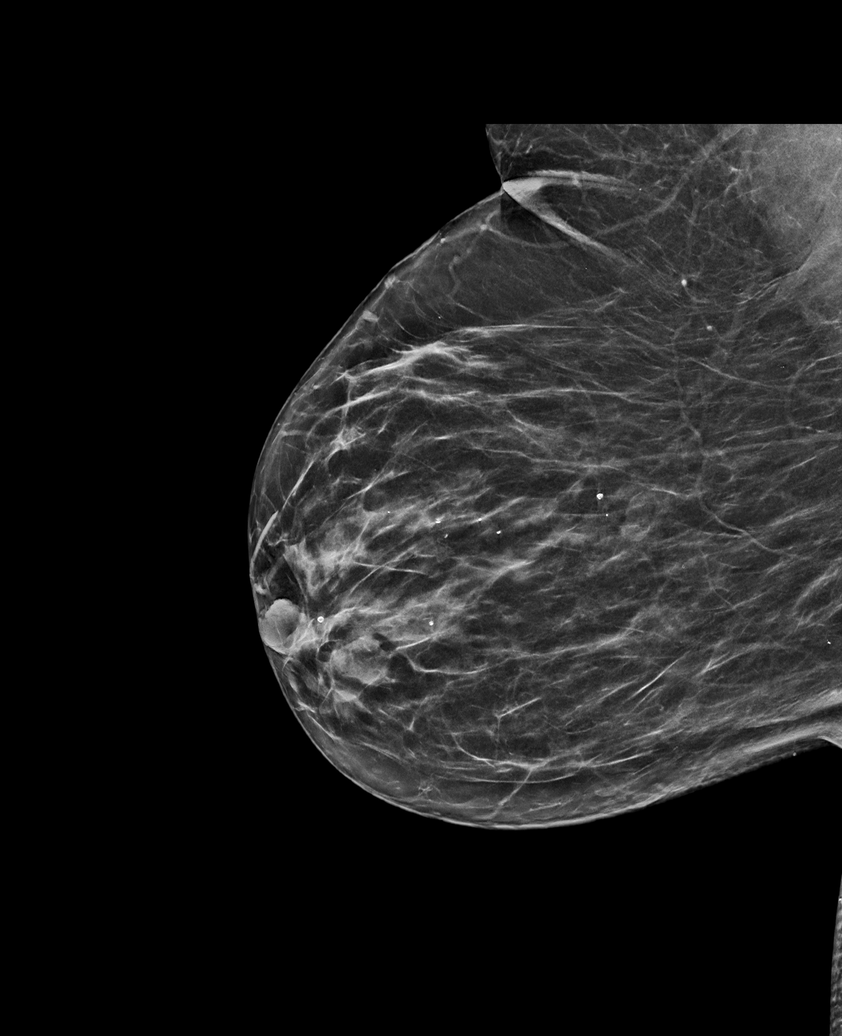

[L CC tomo · tomo slice 27/53.0]
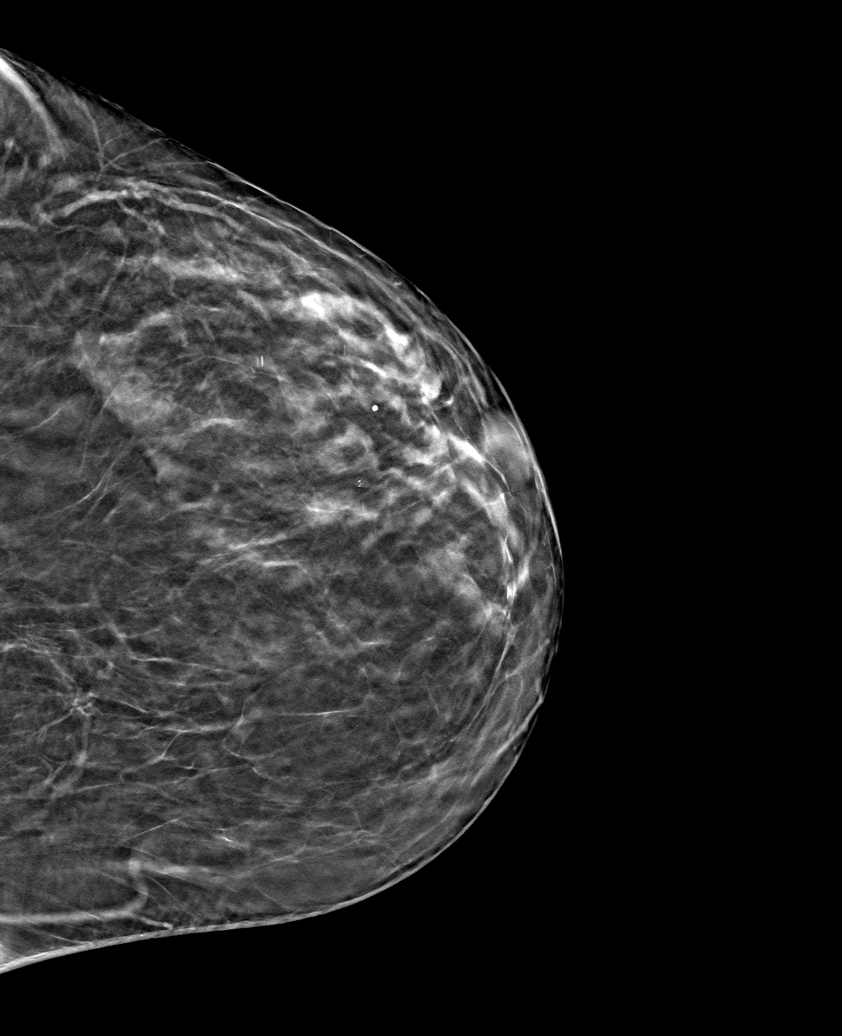

[6 of 30 positions shown; findings below may reference images not displayed]

ACR Breast Density Category b: There are scattered areas of
fibroglandular density.
FINDINGS: There are no findings suspicious for malignancy.
IMPRESSION: No mammographic evidence of malignancy. A result letter of this
screening mammogram will be mailed directly to the patient.

RECOMMENDATION:
Screening mammogram in one year. (Code:51-O-LD2)

BI-RADS CATEGORY  1: Negative.

## 2024-02-28 ENCOUNTER — Inpatient Hospital Stay: Payer: BC Managed Care – PPO | Attending: Hematology and Oncology | Admitting: Hematology and Oncology

## 2024-02-28 VITALS — BP 118/70 | HR 73 | Temp 97.6°F | Resp 17 | Ht 70.0 in | Wt 217.2 lb

## 2024-02-28 DIAGNOSIS — Z87891 Personal history of nicotine dependence: Secondary | ICD-10-CM | POA: Diagnosis not present

## 2024-02-28 DIAGNOSIS — Z803 Family history of malignant neoplasm of breast: Secondary | ICD-10-CM | POA: Insufficient documentation

## 2024-02-28 DIAGNOSIS — Z1721 Progesterone receptor positive status: Secondary | ICD-10-CM | POA: Diagnosis not present

## 2024-02-28 DIAGNOSIS — Z8052 Family history of malignant neoplasm of bladder: Secondary | ICD-10-CM | POA: Diagnosis not present

## 2024-02-28 DIAGNOSIS — Z1732 Human epidermal growth factor receptor 2 negative status: Secondary | ICD-10-CM | POA: Insufficient documentation

## 2024-02-28 DIAGNOSIS — Z8042 Family history of malignant neoplasm of prostate: Secondary | ICD-10-CM | POA: Insufficient documentation

## 2024-02-28 DIAGNOSIS — Z923 Personal history of irradiation: Secondary | ICD-10-CM | POA: Diagnosis not present

## 2024-02-28 DIAGNOSIS — Z79811 Long term (current) use of aromatase inhibitors: Secondary | ICD-10-CM | POA: Diagnosis not present

## 2024-02-28 DIAGNOSIS — Z170 Estrogen receptor positive status [ER+]: Secondary | ICD-10-CM | POA: Diagnosis not present

## 2024-02-28 DIAGNOSIS — C50412 Malignant neoplasm of upper-outer quadrant of left female breast: Secondary | ICD-10-CM | POA: Diagnosis present

## 2024-02-28 MED ORDER — ANASTROZOLE 1 MG PO TABS: 1.0000 mg | ORAL_TABLET | Freq: Every day | ORAL | 3 refills | Status: AC

## 2024-02-28 NOTE — Assessment & Plan Note (Signed)
 This is a very pleasant 68 year old postmenopausal female patient with newly diagnosed left breast IDC, grade 3, ER/PR strongly +95%, HER2 negative, Ki-67 of 40% referred to breast MDC for additional recommendations.  Left Breast Cancer IDC 1.7*1.4*1.3 cms, grade III,  Complete surgical resection with no indication for chemotherapy based on oncotype score of 23.Discussed the plan for adjuvant radiation and hormone therapy. - Proceed with radiation therapy as planned with Dr. Lurena Sally. - Initiate aromatase inhibitor (anastrozole or letrozole) after completion of radiation therapy. - Monitor bone density every 2 years due to potential impact of aromatase inhibitors.  Medication Update Patient has stopped taking oxycodone  post-surgery. - Remove oxycodone  from medication list.  Follow-up Plan to alternate visits with Dr. Cherlynn Cornfield to minimize patient travel. - Schedule follow-up visit after completion of radiation therapy and initiation of aromatase inhibitor therapy.

## 2024-02-28 NOTE — Progress Notes (Signed)
 [facility] CONSULT NOTE  Patient Care Team: Ransom Byers, MD as PCP - General (Family Medicine) Alane Hsu, RN as Oncology Nurse Navigator Auther Bo, RN as Oncology Nurse Navigator Lockie Rima, MD as Consulting Physician (General Surgery) Murleen Arms, MD as Consulting Physician (Hematology and Oncology) Colie Dawes, MD as Attending Physician (Radiation Oncology)  CHIEF COMPLAINTS/PURPOSE OF CONSULTATION:  Newly diagnosed breast cancer  HISTORY OF PRESENTING ILLNESS:  Elizabeth Wood 68 y.o. female is here because of recent diagnosis of left breast cancer  I reviewed her records extensively and collaborated the history with the patient.  SUMMARY OF ONCOLOGIC HISTORY: Oncology History  Malignant neoplasm of upper-outer quadrant of left breast in female, estrogen receptor positive (HCC)  10/31/2023 Mammogram   Screening mammogram and diagnostic mammogram showed suspicious mass with calcs in the left breast at 2:00 measuring 1.7 cm.  Benign sebaceous cyst in the right breast at 3:00 measuring 1.3 cm.   10/31/2023 Pathology Results   Left breast needle core biopsy upper outer quadrant at 2:00 8 cm from the nipple confirmed invasive ductal carcinoma, high-grade, ER/PR positive HER2 negative, Ki-67 of 40%   11/07/2023 Initial Diagnosis   Malignant neoplasm of upper-outer quadrant of left breast in female, estrogen receptor positive (HCC)   11/09/2023 Cancer Staging   Staging form: Breast, AJCC 8th Edition - Clinical stage from 11/09/2023: Stage IA (cT1, cN0, cM0, G3, ER+, PR+, HER2-) - Signed by Murleen Arms, MD on 12/27/2023 Stage prefix: Initial diagnosis Histologic grading system: 3 grade system Laterality: Left Staged by: Pathologist and managing physician Stage used in treatment planning: Yes National guidelines used in treatment planning: Yes Type of national guideline used in treatment planning: NCCN    Discussed the use of AI  scribe software for clinical note transcription with the patient, who gave verbal consent to proceed.  History of Present Illness    Elizabeth Wood is a 68 year old female with breast cancer who presents for follow-up after surgery. She underwent surgery for breast cancer, and the cancer has been completely removed. Recent tests indicate that chemotherapy is not required as her oncotype score is 23, below the threshold of 26.  She recalls a previous discussion about starting an aromatase inhibitor, which she will receive from her current provider. Options include anastrozole, letrozole, and exemestane, with the choice depending on insurance preferences. The medication is to be taken once daily for a minimum of five years, with the possibility of extending the duration based on risk factors.  She went through menopause ten years ago and experienced significant hot flashes at that time, with occasional hot flashes still occurring. Potential side effects of the medication include postmenopausal symptoms such as hot flashes, vaginal dryness, and initial joint aches, which typically subside.  Regarding her postoperative recovery, she only used two oxycodone  pills immediately after surgery. She requests to have oxycodone  removed from her medication list as she no longer uses it.  MEDICAL HISTORY:  Past Medical History:  Diagnosis Date   Cancer (HCC) 11/2023   left breast IDC   Diabetes mellitus without complication (HCC)    Hypertension     SURGICAL HISTORY: Past Surgical History:  Procedure Laterality Date   BREAST BIOPSY Left    benign   BREAST BIOPSY Right    benign   BREAST BIOPSY Left 10/31/2023   US  LT BREAST BX W LOC DEV 1ST LESION IMG BX SPEC US  GUIDE 10/31/2023 [facility]   BREAST BIOPSY Left 11/28/2023  US  LT RADIOACTIVE SEED LOC 11/28/2023 [facility]   BREAST CYST EXCISION Right 11/29/2023   Procedure: CYST EXCISION RIGHT BREAST;  Surgeon: Lockie Rima, MD;   Location: [facility];  Service: General;  Laterality: Right;   BREAST EXCISIONAL BIOPSY Left    BREAST EXCISIONAL BIOPSY Right    BREAST LUMPECTOMY WITH RADIOACTIVE SEED AND SENTINEL LYMPH NODE BIOPSY Left 11/29/2023   Procedure: LEFT SEED LOCALIZED LUMPECTOMY AND SENTINEL LYMPH NODE BIOPSY;  Surgeon: Lockie Rima, MD;  Location: [facility];  Service: General;  Laterality: Left;   CHOLECYSTECTOMY     TONSILECTOMY, ADENOIDECTOMY, BILATERAL MYRINGOTOMY AND TUBES      SOCIAL HISTORY: Social History   Socioeconomic History   Marital status: Married    Spouse name: Not on file   Number of children: Not on file   Years of education: Not on file   Highest education level: Not on file  Occupational History   Not on file  Tobacco Use   Smoking status: Former    Types: Cigarettes   Smokeless tobacco: Never  Substance and Sexual Activity   Alcohol use: Never   Drug use: Never   Sexual activity: Yes    Birth control/protection: Post-menopausal  Other Topics Concern   Not on file  Social History Narrative   Not on file   Social Drivers of Health   Financial Resource Strain: Not on file  Food Insecurity: No Food Insecurity (12/29/2023)   Hunger Vital Sign    Worried About Running Out of Food in the Last Year: Never true    Ran Out of Food in the Last Year: Never true  Transportation Needs: No Transportation Needs (12/29/2023)   PRAPARE - Transportation    Lack of Transportation (Medical): No    Lack of Transportation (Non-Medical): No  Physical Activity: Not on file  Stress: Not on file  Social Connections: Not on file  Intimate Partner Violence: Not At Risk (12/29/2023)   Humiliation, Afraid, Rape, and Kick questionnaire    Fear of Current or Ex-Partner: No    Emotionally Abused: No    Physically Abused: No    Sexually Abused: No    FAMILY HISTORY: Family History  Problem Relation Age of Onset   Prostate cancer Father 36   Breast cancer  Maternal Aunt        dx. >50   Breast cancer Maternal Aunt        dx. >50   Breast cancer Maternal Aunt 50   Breast cancer Paternal Aunt        dx. >50   Breast cancer Paternal Aunt        dx. >50   Bladder Cancer Paternal Aunt        dx. >50    ALLERGIES:  is allergic to lidocaine and novocain [procaine].  MEDICATIONS:  Current Outpatient Medications  Medication Sig Dispense Refill   Cinnamon Bark POWD by Does not apply route.     empagliflozin (JARDIANCE) 25 MG TABS tablet 25 mg daily.     gabapentin (NEURONTIN) 300 MG capsule Take 300 mg by mouth at bedtime.     glipiZIDE (GLUCOTROL) 5 MG tablet Take 5 mg by mouth daily before breakfast.     lisinopril-hydrochlorothiazide (ZESTORETIC) 20-25 MG tablet Take 1 tablet by mouth daily.     metFORMIN  (GLUCOPHAGE ) 500 MG tablet Take 1,000 mg by mouth 2 (two) times daily with a meal.     Multiple Vitamin (MULTIVITAMIN WITH MINERALS) TABS tablet  Take 1 tablet by mouth daily.     OZEMPIC, 1 MG/DOSE, 4 MG/3ML SOPN Inject 1 mg into the skin once a week.     Probiotic Product (ALIGN) 4 MG CAPS Take by mouth.     vitamin B-12 (CYANOCOBALAMIN) 500 MCG tablet Take 500 mcg by mouth daily.     No current facility-administered medications for this visit.    REVIEW OF SYSTEMS:   Constitutional: Denies fevers, chills or abnormal night sweats Eyes: Denies blurriness of vision, double vision or watery eyes Ears, nose, mouth, throat, and face: Denies mucositis or sore throat Respiratory: Denies cough, dyspnea or wheezes Cardiovascular: Denies palpitation, chest discomfort or lower extremity swelling Gastrointestinal:  Denies nausea, heartburn or change in bowel habits Skin: Denies abnormal skin rashes Lymphatics: Denies new lymphadenopathy or easy bruising Neurological:Denies numbness, tingling or new weaknesses Behavioral/Psych: Mood is stable, no new changes  Breast: Denies any palpable lumps or discharge All other systems were reviewed with  the patient and are negative.  PHYSICAL EXAMINATION: ECOG PERFORMANCE STATUS: 0 - Asymptomatic  Vitals:   02/28/24 0946  BP: 118/70  Pulse: 73  Resp: 17  Temp: 97.6 F (36.4 C)  SpO2: 95%    Filed Weights   02/28/24 0946  Weight: 217 lb 3 oz (98.5 kg)     GENERAL:alert, no distress and comfortable SKIN: skin color, texture, turgor are normal, no rashes or significant lesions EYES: normal, conjunctiva are pink and non-injected, sclera clear OROPHARYNX:no exudate, no erythema and lips, buccal mucosa, and tongue normal  NECK: supple, thyroid normal size, non-tender, without nodularity LYMPH:  no palpable lymphadenopathy in the cervical, axillary  LUNGS: clear to auscultation and percussion with normal breathing effort HEART: regular rate & rhythm and no murmurs and no lower extremity edema ABDOMEN:abdomen soft, non-tender and normal bowel sounds Musculoskeletal:no cyanosis of digits and no clubbing  PSYCH: alert & oriented x 3 with fluent speech NEURO: no focal motor/sensory deficits BREAST: No definitive palpable mass in the breast.  No regional adenopathy  LABORATORY DATA:  I have reviewed the data as listed Lab Results  Component Value Date   WBC 11.8 (H) 11/09/2023   HGB 15.6 (H) 11/09/2023   HCT 46.4 (H) 11/09/2023   MCV 89.4 11/09/2023   PLT 281 11/09/2023   Lab Results  Component Value Date   NA 137 11/09/2023   K 4.7 11/09/2023   CL 97 (L) 11/09/2023   CO2 31 11/09/2023    RADIOGRAPHIC STUDIES: I have personally reviewed the radiological reports and agreed with the findings in the report.  ASSESSMENT AND PLAN:  Malignant neoplasm of upper-outer quadrant of left breast in female, estrogen receptor positive (HCC) This is a very pleasant 68 year old postmenopausal female patient with newly diagnosed left breast IDC, grade 3, ER/PR strongly +95%, HER2 negative, Ki-67 of 40% referred to breast [facility] for additional recommendations.  Left Breast Cancer IDC  1.7*1.4*1.3 cms, grade III,  Complete surgical resection with no indication for chemotherapy based on oncotype score of 23.Discussed the plan for adjuvant radiation and hormone therapy. - Proceed with radiation therapy as planned with Dr. Lurena Sally. - Initiate aromatase inhibitor (anastrozole or letrozole) after completion of radiation therapy. - Monitor bone density every 2 years due to potential impact of aromatase inhibitors.  Medication Update Patient has stopped taking oxycodone  post-surgery. - Remove oxycodone  from medication list.  Follow-up Plan to alternate visits with Dr. Cherlynn Cornfield to minimize patient travel. - Schedule follow-up visit after completion of radiation therapy and initiation of  aromatase inhibitor therapy.     All questions were answered. The patient knows to call the clinic with any problems, questions or concerns.    Murleen Arms, MD 02/28/24

## 2024-02-28 NOTE — Progress Notes (Signed)
  Elizabeth Wood presents today for follow-up after completing radiation to her left breast on 02/10/2024. Patient denies any issues.  Pain: Patient denies any breast pain Skin: Skin is looking better and starting to return to normal. Encouraged to use vitamin E oil ROM: Patient denies any issues with range of motion. Lymphedema: Patient denies MedOnc F/U: July 2025 Other issues of note:  None  Pt reports Yes No Comments  Tamoxifen []  []    Letrozole []  []    Anastrazole [x]  []    Mammogram [x]  Date: December 30,2024 []      Encouraged patient to go to their follow up appointments and to call our office with any questions or concerns.

## 2024-03-08 ENCOUNTER — Ambulatory Visit
Admission: RE | Admit: 2024-03-08 | Discharge: 2024-03-08 | Disposition: A | Discharge status: Home / Self Care | Source: Ambulatory Visit | Attending: Radiation Oncology | Admitting: Radiation Oncology

## 2024-03-13 ENCOUNTER — Ambulatory Visit
Admission: RE | Admit: 2024-03-13 | Discharge: 2024-03-13 | Disposition: A | Discharge status: Home / Self Care | Payer: BC Managed Care – PPO | Source: Ambulatory Visit | Attending: Family Medicine

## 2024-03-13 DIAGNOSIS — E2839 Other primary ovarian failure: Secondary | ICD-10-CM

## 2024-05-22 ENCOUNTER — Encounter: Admitting: Adult Health

## 2024-05-22 ENCOUNTER — Other Ambulatory Visit

## 2024-05-28 ENCOUNTER — Encounter: Admitting: Adult Health

## 2024-06-07 ENCOUNTER — Encounter: Payer: Self-pay | Admitting: *Deleted

## 2024-06-07 NOTE — Progress Notes (Signed)
 Called pt to inquire abt missed SCP appt w/ Morna in July. Pt is in [place]  at the time and will be for several more weeks. She will call to r/s when she returns and when she has new work schedule. Pt says she is tolerating anastrozole  but said  it's not my favorite.

## 2024-06-28 ENCOUNTER — Encounter: Payer: Self-pay | Admitting: *Deleted

## 2024-06-28 NOTE — Progress Notes (Signed)
 Called pt to r/s her SCP appt. 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

## 2024-06-29 ENCOUNTER — Telehealth: Payer: Self-pay | Admitting: Adult Health

## 2024-06-29 NOTE — Telephone Encounter (Signed)
 Called and schedule her appt.

## 2024-08-02 ENCOUNTER — Inpatient Hospital Stay: Attending: Adult Health | Admitting: Adult Health

## 2024-08-02 ENCOUNTER — Encounter: Payer: Self-pay | Admitting: Adult Health

## 2024-08-02 DIAGNOSIS — Z8042 Family history of malignant neoplasm of prostate: Secondary | ICD-10-CM

## 2024-08-02 DIAGNOSIS — Z170 Estrogen receptor positive status [ER+]: Secondary | ICD-10-CM

## 2024-08-02 DIAGNOSIS — C50412 Malignant neoplasm of upper-outer quadrant of left female breast: Secondary | ICD-10-CM

## 2024-08-02 DIAGNOSIS — Z8052 Family history of malignant neoplasm of bladder: Secondary | ICD-10-CM

## 2024-08-02 DIAGNOSIS — Z87891 Personal history of nicotine dependence: Secondary | ICD-10-CM

## 2024-08-02 DIAGNOSIS — Z1721 Progesterone receptor positive status: Secondary | ICD-10-CM

## 2024-08-02 DIAGNOSIS — Z1732 Human epidermal growth factor receptor 2 negative status: Secondary | ICD-10-CM | POA: Diagnosis not present

## 2024-08-02 DIAGNOSIS — Z803 Family history of malignant neoplasm of breast: Secondary | ICD-10-CM

## 2024-08-02 NOTE — Progress Notes (Signed)
 SURVIVORSHIP VISIT: I connected with Elizabeth Wood on 08/03/24 at 10:00 AM EDT by telephone and verified that I am speaking with the correct person using two identifiers.  I discussed the limitations, risks, security and privacy concerns of performing an evaluation and management service by telephone and the availability of in person appointments.  I also discussed with the patient that there may be a patient responsible charge related to this service. The patient expressed understanding and agreed to proceed.  Patient location: home Provider location: [facility] office BRIEF ONCOLOGIC HISTORY:  Oncology History  Malignant neoplasm of upper-outer quadrant of left breast in female, estrogen receptor positive (HCC)  10/31/2023 Mammogram   Screening mammogram and diagnostic mammogram showed suspicious mass with calcs in the left breast at 2:00 measuring 1.7 cm.  Benign sebaceous cyst in the right breast at 3:00 measuring 1.3 cm.   10/31/2023 Pathology Results   Left breast needle core biopsy upper outer quadrant at 2:00 8 cm from the nipple confirmed invasive ductal carcinoma, high-grade, ER/PR positive HER2 negative, Ki-67 of 40%   11/07/2023 Initial Diagnosis   Malignant neoplasm of upper-outer quadrant of left breast in female, estrogen receptor positive (HCC)   11/09/2023 Cancer Staging   Staging form: Breast, AJCC 8th Edition - Clinical stage from 11/09/2023: Stage IA (cT1, cN0, cM0, G3, ER+, PR+, HER2-) - Signed by Loretha Ash, MD on 12/27/2023 Stage prefix: Initial diagnosis Histologic grading system: 3 grade system Laterality: Left Staged by: Pathologist and managing physician Stage used in treatment planning: Yes National guidelines used in treatment planning: Yes Type of national guideline used in treatment planning: NCCN   01/16/2024 - 02/10/2024 Radiation Therapy   Plan Name: Breast_L_BH Site: Breast, Left Technique: 3D Mode: Photon Dose Per Fraction: 2.67 Gy Prescribed Dose  (Delivered / Prescribed): 40.05 Gy / 40.05 Gy Prescribed Fxs (Delivered / Prescribed): 15 / 15   Plan Name: Brst_L_Bst_BH Site: Breast, Left Technique: 3D Mode: Photon Dose Per Fraction: 2 Gy Prescribed Dose (Delivered / Prescribed): 10 Gy / 10 Gy Prescribed Fxs (Delivered / Prescribed): 5 / 5   03/2024 -  Anti-estrogen oral therapy   Anastrozole      INTERVAL HISTORY:  Discussed the use of AI scribe software for clinical note transcription with the patient, who gave verbal consent to proceed.  History of Present Illness Elizabeth Wood is a 68 year old female with stage 1A breast cancer who presents for follow-up after treatment.  She experiences occasional sharp pain in the nipple area of the left breast. She underwent a lumpectomy for stage 1A breast cancer in the left breast, which was estrogen and progesterone positive. Chemotherapy was not required based on her oncotype score. No swelling in the arm or breast is present.  She completed radiation therapy and continues to experience skin discoloration on the left breast, which remains noticeably darker than the right side.  She is currently taking anastrozole  and experiences hot flashes approximately once a week. No vaginal dryness or joint aches and pains are present.  Her bone density testing was completed in May.  REVIEW OF SYSTEMS:  Review of Systems  Constitutional:  Negative for appetite change, chills, fatigue, fever and unexpected weight change.  HENT:   Negative for hearing loss, lump/mass and trouble swallowing.   Eyes:  Negative for eye problems and icterus.  Respiratory:  Negative for chest tightness, cough and shortness of breath.   Cardiovascular:  Negative for chest pain, leg swelling and palpitations.  Gastrointestinal:  Negative for abdominal distention, abdominal  pain, constipation, diarrhea, nausea and vomiting.  Endocrine: Negative for hot flashes.  Genitourinary:  Negative for difficulty urinating.    Musculoskeletal:  Negative for arthralgias.  Skin:  Negative for itching and rash.  Neurological:  Negative for dizziness, extremity weakness, headaches and numbness.  Hematological:  Negative for adenopathy. Does not bruise/bleed easily.  Psychiatric/Behavioral:  Negative for depression. The patient is not nervous/anxious.   Breast: Denies any new nodularity, masses, tenderness, nipple changes, or nipple discharge.       PAST MEDICAL/SURGICAL HISTORY:  Past Medical History:  Diagnosis Date   Cancer (HCC) 11/2023   left breast IDC   Diabetes mellitus without complication (HCC)    Hypertension    Past Surgical History:  Procedure Laterality Date   BREAST BIOPSY Left    benign   BREAST BIOPSY Right    benign   BREAST BIOPSY Left 10/31/2023   US  LT BREAST BX W LOC DEV 1ST LESION IMG BX SPEC US  GUIDE 10/31/2023 [facility]   BREAST BIOPSY Left 11/28/2023   US  LT RADIOACTIVE SEED LOC 11/28/2023 [facility]   BREAST CYST EXCISION Right 11/29/2023   Procedure: CYST EXCISION RIGHT BREAST;  Surgeon: Aron Shoulders, MD;  Location: [facility];  Service: General;  Laterality: Right;   BREAST EXCISIONAL BIOPSY Left    BREAST EXCISIONAL BIOPSY Right    BREAST LUMPECTOMY WITH RADIOACTIVE SEED AND SENTINEL LYMPH NODE BIOPSY Left 11/29/2023   Procedure: LEFT SEED LOCALIZED LUMPECTOMY AND SENTINEL LYMPH NODE BIOPSY;  Surgeon: Aron Shoulders, MD;  Location: [facility];  Service: General;  Laterality: Left;   CHOLECYSTECTOMY     TONSILECTOMY, ADENOIDECTOMY, BILATERAL MYRINGOTOMY AND TUBES       ALLERGIES:  Allergies  Allergen Reactions   Lidocaine Anaphylaxis    Patient has tolerated bupivocaine.    Novocain [Procaine] Anaphylaxis     CURRENT MEDICATIONS:  Outpatient Encounter Medications as of 08/02/2024  Medication Sig   anastrozole  (ARIMIDEX ) 1 MG tablet Take 1 tablet (1 mg total) by mouth daily.   Cinnamon Bark POWD by Does not apply route.    empagliflozin (JARDIANCE) 25 MG TABS tablet 25 mg daily.   gabapentin (NEURONTIN) 300 MG capsule Take 300 mg by mouth at bedtime.   glipiZIDE (GLUCOTROL) 5 MG tablet Take 5 mg by mouth daily before breakfast.   lisinopril-hydrochlorothiazide (ZESTORETIC) 20-25 MG tablet Take 1 tablet by mouth daily.   metFORMIN  (GLUCOPHAGE ) 500 MG tablet Take 1,000 mg by mouth 2 (two) times daily with a meal.   Multiple Vitamin (MULTIVITAMIN WITH MINERALS) TABS tablet Take 1 tablet by mouth daily.   OZEMPIC, 1 MG/DOSE, 4 MG/3ML SOPN Inject 1 mg into the skin once a week.   Probiotic Product (ALIGN) 4 MG CAPS Take by mouth.   vitamin B-12 (CYANOCOBALAMIN) 500 MCG tablet Take 500 mcg by mouth daily.   No facility-administered encounter medications on file as of 08/02/2024.     ONCOLOGIC FAMILY HISTORY:  Family History  Problem Relation Age of Onset   Prostate cancer Father 58   Breast cancer Maternal Aunt        dx. >50   Breast cancer Maternal Aunt        dx. >50   Breast cancer Maternal Aunt 68   Breast cancer Paternal Aunt        dx. >50   Breast cancer Paternal Aunt        dx. >50   Bladder Cancer Paternal Aunt  dx. >50     SOCIAL HISTORY:  Social History   Socioeconomic History   Marital status: Married    Spouse name: Not on file   Number of children: Not on file   Years of education: Not on file   Highest education level: Not on file  Occupational History   Not on file  Tobacco Use   Smoking status: Former    Types: Cigarettes   Smokeless tobacco: Never  Substance and Sexual Activity   Alcohol use: Never   Drug use: Never   Sexual activity: Yes    Birth control/protection: Post-menopausal  Other Topics Concern   Not on file  Social History Narrative   Not on file   Social Drivers of Health   Financial Resource Strain: Not on file  Food Insecurity: No Food Insecurity (12/29/2023)   Hunger Vital Sign    Worried About Running Out of Food in the Last Year: Never  true    Ran Out of Food in the Last Year: Never true  Transportation Needs: No Transportation Needs (12/29/2023)   PRAPARE - Transportation    Lack of Transportation (Medical): No    Lack of Transportation (Non-Medical): No  Physical Activity: Not on file  Stress: Not on file  Social Connections: Not on file  Intimate Partner Violence: Not At Risk (12/29/2023)   Humiliation, Afraid, Rape, and Kick questionnaire    Fear of Current or Ex-Partner: No    Emotionally Abused: No    Physically Abused: No    Sexually Abused: No     OBSERVATIONS/OBJECTIVE:  Patient sounds well in no apparent distress  LABORATORY DATA:  None for this visit.  DIAGNOSTIC IMAGING:  None for this visit.      ASSESSMENT AND PLAN:  Elizabeth Wood is a pleasant 68 y.o. female with Stage IA left breast invasive ductal carcinoma, ER+/PR+/HER2-, diagnosed in 10/2023, treated with lumpectomy, adjuvant radiation therapy, and anti-estrogen therapy with Anastrozole  beginning in 03/2024.  She presents to the Survivorship Clinic for our initial meeting and routine follow-up post-completion of treatment for breast cancer.    1. Stage IA right breast cancer:  Elizabeth Wood is continuing to recover from definitive treatment for breast cancer. She will follow-up with her medical oncologist, Dr.  Loretha in 7 months with history and physical exam per surveillance protocol.  She will continue her anti-estrogen therapy with Anastrozole . Thus far, she is tolerating the Anastrozole  well, with minimal side effects. Her mammogram is due 10/2024; orders placed today.     Today, a comprehensive survivorship care plan and treatment summary was reviewed with the patient today detailing her breast cancer diagnosis, treatment course, potential late/long-term effects of treatment, appropriate follow-up care with recommendations for the future, and patient education resources.  A copy of this summary, along with a letter will be sent to the patient's  primary care provider via mail/fax/In Basket message after today's visit.    2. Bone health:  Given Elizabeth Wood age/history of breast cancer and her current treatment regimen including anti-estrogen therapy with Anastrozole , she is at risk for bone demineralization.  Her last DEXA scan was 03/2024 and was normal.  Repeat imaging is due in 03/2026. In the meantime, she was encouraged to increase her consumption of foods rich in calcium, as well as increase her weight-bearing activities.  She was given education on specific activities to promote bone health.  3. Cancer screening:  Due to Elizabeth Wood's history and her age, she should receive screening for skin  cancers, colon cancer, and gynecologic cancers.  The information and recommendations are listed on the patient's comprehensive care plan/treatment summary and were reviewed in detail with the patient.    4. Health maintenance and wellness promotion: Elizabeth Wood was encouraged to consume 5-7 servings of fruits and vegetables per day. We reviewed the [employer] handout.  She was also encouraged to engage in moderate to vigorous exercise for 30 minutes per day most days of the week.  She was instructed to limit her alcohol consumption and continue to abstain from tobacco use.     5. Support services/counseling: It is not uncommon for this period of the patient's cancer care trajectory to be one of many emotions and stressors.   She was given information regarding our available services and encouraged to contact me with any questions or for help enrolling in any of our support group/programs.    Follow up instructions:    -Return to cancer center in 7 months for f/u with Dr. Loretha -See Dr. Aron next month as scheduled -Mammogram due in 10/2024 -DEXA in 03/2026 -She is welcome to return back to the Survivorship Clinic at any time; no additional follow-up needed at this time.  -Consider referral back to survivorship as a long-term survivor  for continued surveillance    The patient was provided an opportunity to ask questions and all were answered. The patient agreed with the plan and demonstrated an understanding of the instructions.   The patient was advised to call back or seek an in-person evaluation if the symptoms worsen or if the condition fails to improve as anticipated.   I provided 10 minutes of non face-to-face telephone visit time during this encounter, and > 50% was spent counseling as documented under my assessment & plan.   Morna Kendall, NP 08/03/24 4:27 PM Medical Oncology and Hematology [facility] [address] [place], [place] [address] Tel. [phone]    Fax. [phone]  *Total Encounter Time as defined by the Centers for Medicare and Medicaid Services includes, in addition to the face-to-face time of a patient visit (documented in the note above) non-face-to-face time: obtaining and reviewing outside history, ordering and reviewing medications, tests or procedures, care coordination (communications with other health care professionals or caregivers) and documentation in the medical record.

## 2024-10-31 ENCOUNTER — Other Ambulatory Visit: Payer: Self-pay | Admitting: Adult Health

## 2024-10-31 ENCOUNTER — Ambulatory Visit
Admission: RE | Admit: 2024-10-31 | Discharge: 2024-10-31 | Disposition: A | Discharge status: Home / Self Care | Source: Ambulatory Visit | Attending: Adult Health | Admitting: Adult Health

## 2024-10-31 DIAGNOSIS — C50412 Malignant neoplasm of upper-outer quadrant of left female breast: Secondary | ICD-10-CM

## 2025-03-05 ENCOUNTER — Ambulatory Visit: Admitting: Hematology and Oncology
